# Patient Record
Sex: Male | Born: 1940 | Race: White | Hispanic: No | Smoking: Former smoker
Health system: Southern US, Community
[De-identification: ages and names within clinical notes are randomized; demographics above are authoritative.]

## PROBLEM LIST (undated history)

## (undated) DIAGNOSIS — E785 Hyperlipidemia, unspecified: Secondary | ICD-10-CM

## (undated) DIAGNOSIS — M5030 Other cervical disc degeneration, unspecified cervical region: Secondary | ICD-10-CM

## (undated) DIAGNOSIS — C679 Malignant neoplasm of bladder, unspecified: Secondary | ICD-10-CM

## (undated) DIAGNOSIS — Z8551 Personal history of malignant neoplasm of bladder: Secondary | ICD-10-CM

## (undated) DIAGNOSIS — K5790 Diverticulosis of intestine, part unspecified, without perforation or abscess without bleeding: Secondary | ICD-10-CM

## (undated) DIAGNOSIS — M5412 Radiculopathy, cervical region: Secondary | ICD-10-CM

## (undated) DIAGNOSIS — Z8546 Personal history of malignant neoplasm of prostate: Secondary | ICD-10-CM

## (undated) DIAGNOSIS — R3129 Other microscopic hematuria: Secondary | ICD-10-CM

## (undated) DIAGNOSIS — R8289 Other abnormal findings on cytological and histological examination of urine: Secondary | ICD-10-CM

## (undated) DIAGNOSIS — E119 Type 2 diabetes mellitus without complications: Secondary | ICD-10-CM

## (undated) DIAGNOSIS — K649 Unspecified hemorrhoids: Secondary | ICD-10-CM

## (undated) DIAGNOSIS — C61 Malignant neoplasm of prostate: Secondary | ICD-10-CM

## (undated) DIAGNOSIS — K635 Polyp of colon: Secondary | ICD-10-CM

## (undated) DIAGNOSIS — D509 Iron deficiency anemia, unspecified: Secondary | ICD-10-CM

## (undated) DIAGNOSIS — Z87442 Personal history of urinary calculi: Secondary | ICD-10-CM

## (undated) DIAGNOSIS — I10 Essential (primary) hypertension: Secondary | ICD-10-CM

## (undated) DIAGNOSIS — K4090 Unilateral inguinal hernia, without obstruction or gangrene, not specified as recurrent: Secondary | ICD-10-CM

## (undated) DIAGNOSIS — N529 Male erectile dysfunction, unspecified: Secondary | ICD-10-CM

## (undated) HISTORY — DX: Personal history of malignant neoplasm of bladder: Z85.51

## (undated) HISTORY — PX: CHOLECYSTECTOMY: SHX55

## (undated) HISTORY — DX: Other abnormal findings on cytological and histological examination of urine: R82.89

## (undated) HISTORY — DX: Male erectile dysfunction, unspecified: N52.9

## (undated) HISTORY — DX: Other microscopic hematuria: R31.29

## (undated) HISTORY — PX: CYSTOSCOPY WITH BIOPSY: SHX5122

## (undated) HISTORY — DX: Radiculopathy, cervical region: M54.12

## (undated) HISTORY — DX: Other cervical disc degeneration, unspecified cervical region: M50.30

## (undated) HISTORY — PX: OTHER SURGICAL HISTORY: SHX169

## (undated) HISTORY — PX: APPENDECTOMY: SHX54

## (undated) HISTORY — DX: Personal history of malignant neoplasm of prostate: Z85.46

---

## 2005-05-20 ENCOUNTER — Ambulatory Visit: Payer: Self-pay | Admitting: Unknown Physician Specialty

## 2005-05-21 ENCOUNTER — Ambulatory Visit: Payer: Self-pay | Admitting: Unknown Physician Specialty

## 2007-04-10 ENCOUNTER — Emergency Department: Payer: Self-pay | Admitting: Emergency Medicine

## 2007-07-01 ENCOUNTER — Emergency Department: Payer: Self-pay | Admitting: Emergency Medicine

## 2007-07-21 ENCOUNTER — Ambulatory Visit: Payer: Self-pay

## 2007-11-21 ENCOUNTER — Other Ambulatory Visit: Payer: Self-pay

## 2007-11-21 ENCOUNTER — Ambulatory Visit: Payer: Self-pay

## 2008-01-25 ENCOUNTER — Ambulatory Visit: Payer: Self-pay | Admitting: Otolaryngology

## 2011-02-02 ENCOUNTER — Ambulatory Visit: Payer: Self-pay | Admitting: Gastroenterology

## 2012-01-24 ENCOUNTER — Inpatient Hospital Stay: Payer: Self-pay | Admitting: Surgery

## 2012-01-24 LAB — URINALYSIS, COMPLETE
Bacteria: NONE SEEN
Bilirubin,UR: NEGATIVE
Glucose,UR: NEGATIVE mg/dL (ref 0–75)
Ketone: NEGATIVE
Leukocyte Esterase: NEGATIVE
Nitrite: NEGATIVE
Protein: NEGATIVE
RBC,UR: 1 /HPF (ref 0–5)
Specific Gravity: 1.021 (ref 1.003–1.030)
Squamous Epithelial: <1

## 2012-01-24 LAB — LIPASE, BLOOD: Lipase: 296 U/L (ref 73–393)

## 2012-01-24 LAB — COMPREHENSIVE METABOLIC PANEL
Albumin: 4 g/dL (ref 3.4–5.0)
Alkaline Phosphatase: 28 U/L — ABNORMAL LOW (ref 50–136)
BUN: 21 mg/dL — ABNORMAL HIGH (ref 7–18)
Co2: 31 mmol/L (ref 21–32)
Creatinine: 1.1 mg/dL (ref 0.60–1.30)
EGFR (African American): >60
Osmolality: 291 (ref 275–301)
Potassium: 3.7 mmol/L (ref 3.5–5.1)
SGOT(AST): 28 U/L (ref 15–37)
Total Protein: 7.7 g/dL (ref 6.4–8.2)

## 2012-01-24 LAB — CBC
HCT: 40.3 % (ref 40.0–52.0)
HGB: 13.3 g/dL (ref 13.0–18.0)
MCH: 28.8 pg (ref 26.0–34.0)
MCV: 87 fL (ref 80–100)
RDW: 13.7 % (ref 11.5–14.5)

## 2012-01-27 LAB — PATHOLOGY REPORT

## 2012-12-28 DIAGNOSIS — Z8551 Personal history of malignant neoplasm of bladder: Secondary | ICD-10-CM

## 2012-12-28 DIAGNOSIS — Z8546 Personal history of malignant neoplasm of prostate: Secondary | ICD-10-CM | POA: Insufficient documentation

## 2012-12-28 DIAGNOSIS — N529 Male erectile dysfunction, unspecified: Secondary | ICD-10-CM | POA: Insufficient documentation

## 2012-12-28 HISTORY — DX: Personal history of malignant neoplasm of prostate: Z85.46

## 2012-12-28 HISTORY — DX: Male erectile dysfunction, unspecified: N52.9

## 2012-12-28 HISTORY — DX: Personal history of malignant neoplasm of bladder: Z85.51

## 2013-01-16 ENCOUNTER — Ambulatory Visit: Payer: Self-pay | Admitting: Urology

## 2013-01-16 LAB — HEMOGLOBIN: HGB: 12.7 g/dL — ABNORMAL LOW (ref 13.0–18.0)

## 2013-01-16 LAB — BASIC METABOLIC PANEL
Anion Gap: 10 (ref 7–16)
Calcium, Total: 9.3 mg/dL (ref 8.5–10.1)
Creatinine: 1.23 mg/dL (ref 0.60–1.30)
EGFR (Non-African Amer.): 60 — ABNORMAL LOW
Glucose: 139 mg/dL — ABNORMAL HIGH (ref 65–99)
Osmolality: 286 (ref 275–301)

## 2013-01-17 LAB — URINE CULTURE

## 2013-01-24 ENCOUNTER — Ambulatory Visit: Payer: Self-pay | Admitting: Urology

## 2013-01-25 LAB — PATHOLOGY REPORT

## 2013-01-27 ENCOUNTER — Emergency Department: Payer: Self-pay | Admitting: Emergency Medicine

## 2013-01-27 LAB — CBC
MCHC: 33.7 g/dL (ref 32.0–36.0)
MCV: 85 fL (ref 80–100)
RBC: 4.39 10*6/uL — ABNORMAL LOW (ref 4.40–5.90)
RDW: 14.3 % (ref 11.5–14.5)
WBC: 10.2 10*3/uL (ref 3.8–10.6)

## 2013-01-27 LAB — COMPREHENSIVE METABOLIC PANEL
Anion Gap: 8 (ref 7–16)
BUN: 26 mg/dL — ABNORMAL HIGH (ref 7–18)
Bilirubin,Total: 0.6 mg/dL (ref 0.2–1.0)
Calcium, Total: 9.2 mg/dL (ref 8.5–10.1)
Creatinine: 1.22 mg/dL (ref 0.60–1.30)
EGFR (African American): >60
Potassium: 4.3 mmol/L (ref 3.5–5.1)
SGOT(AST): 30 U/L (ref 15–37)
Sodium: 138 mmol/L (ref 136–145)
Total Protein: 7.9 g/dL (ref 6.4–8.2)

## 2013-01-27 LAB — URINALYSIS, COMPLETE
Glucose,UR: NEGATIVE mg/dL (ref 0–75)
Ketone: NEGATIVE
Nitrite: NEGATIVE
Protein: NEGATIVE
RBC,UR: 66 /HPF (ref 0–5)
Specific Gravity: 1.011 (ref 1.003–1.030)
Squamous Epithelial: NONE SEEN
WBC UR: 25 /HPF (ref 0–5)

## 2014-03-25 ENCOUNTER — Ambulatory Visit: Payer: Self-pay | Admitting: Urology

## 2014-03-26 LAB — URINE CULTURE

## 2014-04-03 ENCOUNTER — Ambulatory Visit: Payer: Self-pay | Admitting: Urology

## 2014-04-05 LAB — PATHOLOGY REPORT

## 2014-12-30 DIAGNOSIS — M5412 Radiculopathy, cervical region: Secondary | ICD-10-CM

## 2014-12-30 DIAGNOSIS — M5030 Other cervical disc degeneration, unspecified cervical region: Secondary | ICD-10-CM

## 2014-12-30 HISTORY — DX: Radiculopathy, cervical region: M54.12

## 2014-12-30 HISTORY — DX: Other cervical disc degeneration, unspecified cervical region: M50.30

## 2015-01-07 ENCOUNTER — Ambulatory Visit: Payer: Self-pay | Admitting: Physical Medicine and Rehabilitation

## 2015-02-07 NOTE — Op Note (Signed)
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   PROCEDURES:  1.  Cystoscopy with bladder biopsies.  2.  Bilateral retrograde pyelograms, bilateral.   SURGEON: John Giovanni, M.D.   ASSISTANT: None.   ANESTHESIA: General.   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 His last recurrence was greater than 10 years ago. Recent surveillance cystoscopy in the office was unremarkable; however, a urine cytology returned abnormal cells and FISH was positive. He presents for further evaluation under anesthesia.   FINDINGS:  1.  Urethra. There were wide caliber bulb strictures of which a 21-French scope easily negotiated. No lesions were noted.  2.  Prostate surgically absent without bladder neck contracture. 3.  Bladder. Ureteral orifices were normal appearing with clear efflux. In the upper posterior wall and left lateral wall, adjacent areas of scar. There was minimal erythema with slight friability. No solid or papillary lesions were seen.  4.  Retrograde pyelograms. Ureters and collecting systems were normal in appearance without filling defects, mass, hydronephrosis or hydroureter bilaterally.   DESCRIPTION OF PROCEDURE: The patient was taken to the operating room where general anesthetic was administered. He was then placed in the low lithotomy position and his external genitalia were prepped and draped in the usual fashion. A time out was performed per protocol. A 21-French cystoscope was lubricated and passed under direct vision with findings as described above. A 5-French open-ended ureteral catheter was placed through the cystoscope and into the  left ureter and retrograde pyelogram was performed. Then the procedure was performed on the contralateral side with findings as described above. A cold biopsy forceps was then placed the cystoscope and the noted areas of erythema were biopsied and sent separately. Random bladder biopsies were also performed of the right lateral wall and trigone. All biopsy sites were fulgurated with a Bovie electrode. At the completion of the procedure, hemostasis was adequate. Cystoscope was removed. A 16-French Foley catheter was placed with return of light rosea effluent upon irrigation. He was taken to PACU in stable condition. There were no complications. EBL minimal.    ____________________________ Ronda Fairly. Bernardo Heater, MD scs:aw D: 01/24/2013 08:15:08 ET T: 01/24/2013 08:33:47 ET JOB#: [id number]  cc: Nicki Reaper C. Bernardo Heater, MD, <Dictator> Abbie Sons MD ELECTRONICALLY SIGNED 01/25/2013 8:06

## 2015-02-08 NOTE — Op Note (Signed)
PATIENT NAME:  Chase Wallace, Baumgarten MR#:  [id number] DATE OF BIRTH:  1940-12-04  DATE OF PROCEDURE:  04/03/2014  PREOPERATIVE DIAGNOSIS: Abnormal urine cytology with history of bladder cancer.   POSTOPERATIVE DIAGNOSIS:  Abnormal urine cytology with history of bladder cancer.   PROCEDURES:  1. Cystoscopy with random bladder biopsies.  2. Bilateral retrograde pyelograms.  3. Bilateral saline washings of the renal pelvis.   SURGEON:  John Giovanni, MD  ASSISTANT: None.   ANESTHETIC: General.   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  Surveillance cystoscopy performed October 2014.  It was unremarkable; however, a urine cytology returned moderately atypical urothelial cells and FISH was positive. He was in [place] after that visit and wanted to wait until spring for followup. A repeat urine cytology performed April 2015 returned dysplastic urothelial cells and FISH was positive. He denies gross hematuria. He has no voiding complaints.   DESCRIPTION OF PROCEDURE: The patient was taken to the operating room, placed on the table in the supine position. A general anesthetic was administered. He was then placed in the low lithotomy position and his external genitalia were prepped and draped in the usual fashion. A timeout was performed. A 21-French cystoscope was lubricated and passed under direct vision. The urethra was normal in caliber without stricture or lesions. The prostate was surgically absent. There was no evidence of bladder neck contracture. Bladder mucosa was closely inspected. There were no solid or papillary lesions present. There were some glomerulations noted throughout the posterior wall. The ureteral orifices were normal-appearing with a clear efflux. Using cold cup biopsy forceps, random bladder biopsies were obtained of the right lateral wall, posterior wall, left lateral wall and dome. Biopsy sites were fulgurated  with a Bugbee electrode. An 8-French cone-tipped catheter was placed through the cystoscope and placed into the left ureteral orifice. Retrograde pyelogram was performed and the left ureter was normal in caliber without dilation or filling defect. The left renal pelvis and collecting system showed delicate calyces without dilation or filling defect. There was good drainage of contrast from the system noted. A right retrograde pyelogram was performed in a similar fashion with no abnormal findings. A 0.035 guidewire was then placed through the cystoscope and into the left ureteral orifice. A 5-French open end ureteral catheter was placed up to the renal pelvis. Saline barbotage was performed and the specimen was collected and sent for cytology. The right ureteral orifice was catheterized in a similar fashion and saline barbotage was performed on the right. Ureteral catheter was removed. At the completion of procedure, hemostasis was adequate. A 16-French Foley catheter was placed with return of clear effluent upon irrigation.  B and O suppositories placed per rectum. He was taken to the PACU in stable condition. There were no complications. EBL was minimal.     ____________________________ Ronda Fairly. Bernardo Heater, MD scs:dd D: 04/03/2014 16:48:24 ET T: 04/03/2014 18:16:02 ET JOB#: [id number]  cc: Nicki Reaper C. Bernardo Heater, MD, <Dictator> Abbie Sons MD ELECTRONICALLY SIGNED 05/01/2014 22:13

## 2015-02-09 NOTE — H&P (Signed)
PATIENT NAME:  Chase Wallace, Chase Wallace MR#:  [id number] DATE OF BIRTH:  Oct 22, 1940  DATE OF ADMISSION:  01/24/2012  ADMITTING DIAGNOSES:  1. Acute calculus cholecystitis.  2. Right upper quadrant abdominal pain.  3. Hypertension.  4. Type 2 diabetes.  5. History of prostate cancer.   CHIEF COMPLAINT: Abdominal pain starting acutely yesterday at 4:00 p.m.   HISTORY: This is a 74 year old type II diabetic with a history of prostate cancer in remission status post prostatectomy who presents to the emergency room with acute onset of abdominal pain located in his right upper quadrant and right flank starting rather acutely yesterday around 4 or 5 o'clock in the afternoon. The patient denies any previous episodes of pain such as this. He is currently hungry. The patient denies any fever or jaundice. He has a mother with cholelithiasis requiring cholecystectomy in the past. He denies dysuria or jaundice. No sick contacts. Work-up in the emergency room consisted of an ultrasound which I have personally reviewed which demonstrates calcified gallstones and trace pericholecystic fluid, but no gallbladder wall thickening and no biliary ductal dilatation evident. A noncontrasted CT scan was also performed which demonstrates a mildly distended gallbladder with at least two large calcified stones seen within the gallbladder neck.   ALLERGIES: No known drug allergies.   MEDICATIONS: Lisinopril, metformin, Prilosec, and Zocor.   PAST MEDICAL HISTORY:  1. Hypertension.  2. Type 2 diabetes. 3. Prostate cancer.  4. Hyperlipidemia.  5. Reflux.   PAST SURGICAL HISTORY:  1. Open prostatectomy.  2. Open appendectomy. 3. Penile prosthesis.  4. Lancing of thrombosed hemorrhoid.  5. Colonoscopy.   SOCIAL HISTORY: The patient is retired, does not smoke, and does not drink.   FAMILY HISTORY: History is significant for cholelithiasis.   REVIEW OF SYSTEMS: Review is significant for abdominal pain. No fever. No jaundice.  No dysuria. No headache. No visual changes. The remaining ten-point review is unremarkable.   PHYSICAL EXAMINATION:   GENERAL: The patient is slightly disheveled and ill appearing, but nontoxic appearing.  VITALS: Pulse 63, respiratory rate 20 - unlabored and on room air, oxygen saturation 97%, blood pressure 160/75, and temperature 97.5. Weight 170 pounds and BMI 26.7.   LUNGS: Clear.   HEART: Regular rate and rhythm.   ABDOMEN: Abdomen demonstrates a lower midline scar, right lower quadrant transverse scar, significant tenderness to deep palpation and positive Murphy sign seen within the right upper quadrant. No obvious mass. No hernia.   RECTAL/GENITOURINARY: Deferred.   EXTREMITIES: Warm and well perfused.   NEUROLOGIC: Grossly normal. Affect is normal. Extraocular muscles are intact. Facies are symmetrical.   LABS/STUDIES: Urinalysis is negative.   Lipase is 296, which is normal. White count 10.1, hemoglobin 13.3, and platelet count 162,000. Total bilirubin 0.7, alkaline phosphatase 28, ALT 27, and AST 28. Sodium 143, potassium 3.7, chloride 103, glucose 149, and BUN 21.   REVIEW OF X-RAYS:  1. Ultrasound as described above, personally reviewed on PACS monitor.  2. CT scan as described above, personally reviewed on PACS monitor.   IMPRESSION: A 74 year old white male with history of hypertension and type 2 diabetes who presents with acute abdominal pain consistent with acute calculus cholecystitis of significant abdominal pain.   PLAN: The patient will be admitted. His pain will tried to be controlled with intravenous narcotics and antiemetics, hydration, and intravenous antibiotics will be administered. We will discuss with him cholecystectomy during this hospitalization when OR availability time becomes available.   TOTAL TIME SPENT: 45 minutes. ____________________________ Elta Gu[place]  Bettina Gavia, MD mab:slb D: 01/24/2012 07:52:05 ET T: 01/24/2012 09:46:18  ET JOB#: [id number]  cc: Elta Guadeloupe A. Marina Gravel, MD, <Dictator> Einar Pheasant, MD [place] MD ELECTRONICALLY SIGNED 01/26/2012 17:54

## 2015-02-09 NOTE — Discharge Summary (Signed)
PATIENT NAME:  Chase Wallace, Mangieri MR#:  [id number] DATE OF BIRTH:  07-27-1941  DATE OF ADMISSION:  01/24/2012 DATE OF DISCHARGE:  01/25/2012  PRINCIPLE DIAGNOSIS: Acute calculus cholecystitis with gallbladder hydrops.   OTHER DIAGNOSES:  1. Hypertension.  2. Type 2 diabetes mellitus. 3. History of prostate cancer.  PRINCIPLE PROCEDURE PERFORMED DURING THIS ADMISSION: Laparoscopic cholecystectomy.   HOSPITAL COURSE: Mr. Dicostanzo was admitted to the hospital, given IV fluids and antibiotics and taken to the Operating Room where he underwent the above-mentioned procedure for the above-mentioned diagnosis. On postoperative day one his temperature maximum was 100.8. He had had three loose bowel movements and was tolerating a diet and his pain was controlled with Norco therefore he was discharged home. He was asked to make an appointment to see me in the office and to call in the interim for any problems.   ____________________________ Consuela Mimes, MD wfm:cms D: 01/25/2012 21:21:24 ET T: 01/26/2012 10:15:13 ET JOB#: [id number]  cc: Consuela Mimes, MD, <Dictator> Consuela Mimes MD ELECTRONICALLY SIGNED 01/27/2012 21:42

## 2015-02-09 NOTE — Op Note (Signed)
PATIENT NAME:  Chase Wallace, Chase Wallace MR#:  [id number] DATE OF BIRTH:  01-14-1941  DATE OF PROCEDURE:  01/24/2012  PROCEDURE PERFORMED: Laparoscopic cholecystectomy.   PREOPERATIVE DIAGNOSIS: Acute cholecystitis.   POSTOPERATIVE DIAGNOSES:  1. Acute cholecystitis.  2. Gallbladder hydrops.   SURGEON: Consuela Mimes, MD  ANESTHESIA: General.   PROCEDURE IN DETAIL: The patient was placed supine on the Operating Room table and prepped and draped in the usual sterile fashion. A 15 mmHg CO2 pneumoperitoneum was created via a Veress needle that was used just to the right of the umbilicus and just slightly superior to it. This was exchanged for a 5 mm trocar and a 25 degrees angled scope. Remaining trocars were placed under direct visualization. The gallbladder had some loosely adherent omentum to it and this was peeled away and then the gallbladder was markedly distended and tense and it was decompressed of completely clear bile and then the fundus was grasped and retracted superiorly and ventrally and there was a lot of fat around the infundibulum of the gallbladder and this was all dissected off until the gallbladder wall itself could be retracted laterally in order to open up the triangle of Calot. Careful dissection here from the gallbladder down toward the common bile duct revealed a clear gallbladder cystic duct junction but the cystic duct itself was quite large. Anterior cystic artery was doubly clipped and divided and subsequently two posterior cystic arteries were each individually doubly clipped and divided such that a total of six hemoclips were left in the patient. Initially a hemolok clip was attempted to be placed over the cystic duct but it would not quite reach being about 1 mm too short. Therefore, a regular Hemoclip was placed at the gallbladder cystic junction and the cystic duct was cut. There was no bile that emanated from the cut cystic duct and I tried to squeeze what I thought was the  cystic duct between the gallbladder and the common bile duct to see if there was a stone impacted in the cystic duct stump but I could not ascertain whether there was one or not. It was my belief that one was not present but as I stated previously there was no bile that emanated from the cystic duct stump. The cystic duct stump was closed with a PDS Endoloop and the gallbladder was then removed from the liver bed with electrocautery, placed in an Endo Catch bag and extracted from the abdomen via the epigastric port. This port site fascia was stretched with a Kelly clamp and then the gallbladder was able to come through it. This fascia was then closed with a single 0 Vicryl suture using the laparoscopic puncture closure device and the right upper quadrant was then reinspected and irrigated with copious amounts of warm normal saline and this was suctioned clear. Hemostasis was excellent and the clips and Endoloop were secure. Some omentum was stuffed into the gallbladder bed and then the peritoneum was desufflated and decannulated and all four skin sites were closed with subcuticular 5-0 Monocryl and suture strips. The patient tolerated the procedure well. There were no complications.   ____________________________ Consuela Mimes, MD wfm:cms D: 01/24/2012 22:03:29 ET T: 01/25/2012 09:05:38 ET JOB#: [id number]  cc: Consuela Mimes, MD, <Dictator> Consuela Mimes MD ELECTRONICALLY SIGNED 01/25/2012 21:51

## 2015-06-03 DIAGNOSIS — R3129 Other microscopic hematuria: Secondary | ICD-10-CM

## 2015-06-03 HISTORY — DX: Other microscopic hematuria: R31.29

## 2015-06-04 DIAGNOSIS — R8289 Other abnormal findings on cytological and histological examination of urine: Secondary | ICD-10-CM

## 2015-06-04 HISTORY — DX: Other abnormal findings on cytological and histological examination of urine: R82.89

## 2016-01-19 ENCOUNTER — Other Ambulatory Visit: Payer: Self-pay | Admitting: Family Medicine

## 2016-01-19 ENCOUNTER — Ambulatory Visit
Admission: RE | Admit: 2016-01-19 | Discharge: 2016-01-19 | Disposition: A | Discharge status: Home / Self Care | Payer: Medicare PPO | Source: Ambulatory Visit | Attending: Family Medicine | Admitting: Family Medicine

## 2016-01-19 DIAGNOSIS — R531 Weakness: Secondary | ICD-10-CM

## 2016-01-19 DIAGNOSIS — K760 Fatty (change of) liver, not elsewhere classified: Secondary | ICD-10-CM | POA: Insufficient documentation

## 2016-01-19 DIAGNOSIS — R748 Abnormal levels of other serum enzymes: Secondary | ICD-10-CM

## 2016-01-19 DIAGNOSIS — D696 Thrombocytopenia, unspecified: Secondary | ICD-10-CM

## 2016-01-19 DIAGNOSIS — R918 Other nonspecific abnormal finding of lung field: Secondary | ICD-10-CM | POA: Insufficient documentation

## 2016-01-19 DIAGNOSIS — R197 Diarrhea, unspecified: Secondary | ICD-10-CM

## 2016-01-19 MED ORDER — IOHEXOL 350 MG/ML SOLN
125.0000 mL | Freq: Once | INTRAVENOUS | Status: AC | PRN
  Administered 2016-01-19: 125 mL via INTRAVENOUS

## 2017-08-08 ENCOUNTER — Other Ambulatory Visit: Payer: Medicare PPO

## 2017-08-08 ENCOUNTER — Other Ambulatory Visit: Payer: Self-pay

## 2017-08-08 DIAGNOSIS — Z8042 Family history of malignant neoplasm of prostate: Secondary | ICD-10-CM

## 2017-08-09 LAB — PSA

## 2017-08-10 ENCOUNTER — Ambulatory Visit (INDEPENDENT_AMBULATORY_CARE_PROVIDER_SITE_OTHER): Payer: Medicare PPO | Admitting: Urology

## 2017-08-10 ENCOUNTER — Encounter: Payer: Self-pay | Admitting: Urology

## 2017-08-10 VITALS — BP 177/93 | HR 101 | Ht 67.0 in | Wt 162.7 lb

## 2017-08-10 DIAGNOSIS — Z8546 Personal history of malignant neoplasm of prostate: Secondary | ICD-10-CM

## 2017-08-10 DIAGNOSIS — Z8551 Personal history of malignant neoplasm of bladder: Secondary | ICD-10-CM

## 2017-08-10 DIAGNOSIS — N529 Male erectile dysfunction, unspecified: Secondary | ICD-10-CM | POA: Diagnosis not present

## 2017-08-10 NOTE — Progress Notes (Signed)
08/10/2017 2:19 PM   Chase Wallace May 10, 1941 [id number]  Referring provider: Einar Pheasant, [address] [place], [place] [address]  Chief Complaint  Patient presents with  . Follow-up    HPI: 76 yo male with a history of low-grade Ta urothelial carcinoma. His last tumor was in 1999 however in 2013 he had urine cytologies which showed atypical cells. This has been extensively evaluated with multiple biopsies including bluelight cystoscopy. He had one biopsy in 2015 which showed PULMP. This was no evidence of upper tract abnormalities on CT urogram, retrograde pyelogram and selective cytologies.  Cystoscopy performed June 2018 was negative.  He also was diagnosed with high risk prostate cancer in 2000 and is status post radical prostatectomy.  He had postoperative erectile dysfunction and had placement of a penile prosthesis.  He states his prosthesis is still functioning however does take a longer time to deflate.  PMH: Past Medical History:  Diagnosis Date  . Abnormal findings on cytological and histological examination of urine 06/04/2015  . Abnormal urine cytology 06/04/2015  . Cervical radiculitis 12/30/2014  . DDD (degenerative disc disease), cervical 12/30/2014  . 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 History of bladder cancer 12/28/2012  . Male erectile dysfunction 12/28/2012  . Microscopic hematuria 06/03/2015  . Other cervical disc degeneration, unspecified cervical region 12/30/2014  . Personal history of prostate cancer 12/28/2012    Surgical History: . Prostatectomy  Radical Retropubic 1999  . Transurethral resection of prostate  . Appendectomy  . Cholecystectomy 2013  . Pr cystourethroscopy,biopsy N/A 09/04/2014  Procedure: CYSTOURETHROSCOPY, WITH BIOPSY(S); Surgeon: Abbie Sons, MD; Location: [facility]; Service: Urology  . Pr cystourethroscopy,fulgur <0.5 cm lesn N/A 11/13/2014  Procedure:  CYSTOURETHROSCOPY, W/FULGURATION (INCL CRYOSURGERY/LASER SURG) OR TX MINOR (<0.5CM) LESION(S) W/WO BIOPSY; Surgeon: Ronnald Nian, MD; Location: [facility]; Service: Urology  . Pr dil uret stric,male,init,filiform N/A 11/13/2014  Procedure: DILATION OF URETHRAL STRICTURE BY PASSAGE OF FILIFORM AND FOLLOWER, MALE; INITIAL; Surgeon: Ronnald Nian, MD; [facility]: [facility]; Service: Urology  . Pr insert,temp indwelling blad cath,simple N/A 11/13/2014  Procedure: INSERTION OF TEMPORARY INDWELLING BLADDER CATHETER; SIMPLE (EG, FOLEY); Surgeon: Ronnald Nian, MD; Location: [facility]; Service: Urology  . Pr cystourethroscopy,ureter catheter Bilateral 06/11/2015  Procedure: CYSTOURETHROSCOPY, W/URETERAL CATHETERIZATION, W/WO IRRIG, INSTILL, OR URETEROPYELOG, EXCLUS OF RADIOLG SVC; Surgeon: Abbie Sons, MD; Location: [facility]; Service: Urology    Home Medications:  Allergies as of 08/10/2017      Reactions   Nsaids Other (See Comments)   Elevated creatinine      Medication List       Accurate as of 08/10/17  2:19 PM. Always use your most recent med list.          amLODipine 10 MG tablet Commonly known as:  NORVASC Take 10 mg by mouth.   atorvastatin 20 MG tablet Commonly known as:  LIPITOR Take 20 mg by mouth.   azelastine 0.1 % nasal spray Commonly known as:  ASTELIN   fluticasone 50 MCG/ACT nasal spray Commonly known as:  FLONASE   glipiZIDE 5 MG tablet Commonly known as:  GLUCOTROL Take 5 mg by mouth.   HYDROcodone-acetaminophen 5-325 MG tablet Commonly known as:  NORCO/VICODIN 1/2-1 po bid Earliest Fill Date: 07/23/17   lisinopril 20 MG tablet Commonly known as:  PRINIVIL,ZESTRIL Take 20 mg by mouth.   metFORMIN 500 MG tablet Commonly known as:  GLUCOPHAGE  Take 500 mg by mouth.   omeprazole 20 MG capsule Commonly known as:  PRILOSEC Take 20 mg by mouth.   triamcinolone 0.025 % cream Commonly  known as:  KENALOG Frequency:BID   Dosage:0.0     Instructions:  Note:Dose: 0.025 %       Allergies:  Allergies  Allergen Reactions  . Nsaids Other (See Comments)    Elevated creatinine    Family History: History reviewed. No pertinent family history.  Social History:  reports that he has quit smoking. He does not have any smokeless tobacco history on file. His alcohol and drug histories are not on file.  ROS: UROLOGY Frequent Urination?: No Hard to postpone urination?: No Burning/pain with urination?: No Get up at night to urinate?: No Leakage of urine?: No Urine stream starts and stops?: No Trouble starting stream?: No Do you have to strain to urinate?: No Blood in urine?: No Urinary tract infection?: No Sexually transmitted disease?: No Injury to kidneys or bladder?: No Painful intercourse?: No Weak stream?: No Erection problems?: No Penile pain?: No  Gastrointestinal Nausea?: No Vomiting?: No Indigestion/heartburn?: No Diarrhea?: No Constipation?: No  Constitutional Fever: No Night sweats?: No Weight loss?: No Fatigue?: No  Skin Skin rash/lesions?: No Itching?: No  Eyes Blurred vision?: No Double vision?: No  Ears/Nose/Throat Sore throat?: No Sinus problems?: No  Hematologic/Lymphatic Swollen glands?: No Easy bruising?: No  Cardiovascular Leg swelling?: No Chest pain?: No  Respiratory Cough?: No Shortness of breath?: No  Endocrine Excessive thirst?: No  Musculoskeletal Back pain?: No Joint pain?: No  Neurological Headaches?: No Dizziness?: No  Psychologic Depression?: No Anxiety?: No  Physical Exam: BP (!) 177/93 (BP Location: Right Arm, Patient Position: Sitting, Cuff Size: Normal)   Pulse (!) 101   Ht 5\' 7"  (1.702 m)   Wt 162 lb 11.2 oz (73.8 kg)   BMI 25.48 kg/m   Constitutional:  Alert and oriented, No acute distress. HEENT: [place] AT, moist mucus membranes.  Trachea midline, no masses. Cardiovascular: No clubbing,  cyanosis, or edema. Respiratory: Normal respiratory effort, no increased work of breathing. GI: Abdomen is soft, nontender, nondistended, no abdominal masses GU: No CVA tenderness.  Skin: No rashes, bruises or suspicious lesions. Lymph: No cervical or inguinal adenopathy. Neurologic: Grossly intact, no focal deficits, moving all 4 extremities. Psychiatric: Normal mood and affect.  Laboratory Data: Lab Results  Component Value Date   WBC 10.2 01/27/2013   HGB 12.6 (L) 01/27/2013   HCT 37.3 (L) 01/27/2013   MCV 85 01/27/2013   PLT 220 01/27/2013    Lab Results  Component Value Date   CREATININE 1.22 01/27/2013    Lab Results  Component Value Date   PSA1 <0.1 08/08/2017     Urinalysis   Assessment & Plan:    1. Personal history of prostate cancer PSA drawn 10/22 undetectable at <0.1.  Continue annual follow-up.  2. History of bladder cancer He has elected to discontinue surveillance cystoscopy however will come in for an annual urinalysis  3. ED (erectile dysfunction) of organic origin    Return in about 1 year (around 08/10/2018) for Recheck.      Abbie Sons, [place] [address], [place], [place] [address] [phone]

## 2018-08-10 ENCOUNTER — Ambulatory Visit: Payer: Medicare PPO | Admitting: Urology

## 2018-08-17 ENCOUNTER — Ambulatory Visit (INDEPENDENT_AMBULATORY_CARE_PROVIDER_SITE_OTHER): Payer: Medicare PPO | Admitting: Urology

## 2018-08-17 ENCOUNTER — Encounter: Payer: Self-pay | Admitting: Urology

## 2018-08-17 VITALS — BP 161/82 | HR 99 | Ht 66.0 in | Wt 165.0 lb

## 2018-08-17 DIAGNOSIS — Z8546 Personal history of malignant neoplasm of prostate: Secondary | ICD-10-CM

## 2018-08-17 NOTE — Progress Notes (Signed)
08/17/2018 1:28 PM   Chase Wallace 11-Aug-1941 [id number]  Referring provider: Einar Pheasant, [address] [place], [place] [address]  Chief Complaint  Patient presents with  . Prostate Cancer   Urologic history: 1. T2 Gleason 4+5 prostate cancer status post radical prostatectomy 2000; PSA undetectable since surgery  2.  History low-grade Ta urothelial carcinoma -Last recurrence 1999 -2013 he had urine cytologies which showed atypical cells. This has been extensively evaluated with multiple biopsies including bluelight cystoscopy. He had one biopsy in 2015 which showed PULMP. No evidence of upper tract abnormalities on CT urogram, retrograde pyelogram and selective cytologies.  He elected to discontinue annual surveillance cystoscopy  3.  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  He has done well the past year.  He has no bothersome lower urinary tract symptoms.  Denies dysuria or gross hematuria.  Denies flank, abdominal, pelvic or scrotal pain.   PMH: Past Medical History:  Diagnosis Date  . Abnormal findings on cytological and histological examination of urine 06/04/2015  . Abnormal urine cytology 06/04/2015  . Cervical radiculitis 12/30/2014  . DDD (degenerative disc disease), cervical 12/30/2014  . 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 History of bladder cancer 12/28/2012  . Male erectile dysfunction 12/28/2012  . Microscopic hematuria 06/03/2015  . Other cervical disc degeneration, unspecified cervical region 12/30/2014  . Personal history of prostate cancer 12/28/2012    Surgical History: Multiple TURBT Radical prostatectomy 2000 Placement penile prosthesis  Home Medications:  Allergies as of 08/17/2018      Reactions   Nsaids Other (See Comments)   Elevated creatinine      Medication List        Accurate as of 08/17/18  1:28 PM. Always use  your most recent med list.          amLODipine 10 MG tablet Commonly known as:  NORVASC Take 10 mg by mouth.   atorvastatin 20 MG tablet Commonly known as:  LIPITOR Take 20 mg by mouth.   azelastine 0.1 % nasal spray Commonly known as:  ASTELIN   fluticasone 50 MCG/ACT nasal spray Commonly known as:  FLONASE   glipiZIDE 5 MG tablet Commonly known as:  GLUCOTROL Take 5 mg by mouth.   HYDROcodone-acetaminophen 5-325 MG tablet Commonly known as:  NORCO/VICODIN 1/2-1 po bid Earliest Fill Date: 07/23/17   Influenza vac split quadrivalent PF 0.5 ML injection Commonly known as:  FLUZONE HIGH-DOSE Fluzone High-Dose 2017-2018 (PF) 180 mcg/0.5 mL intramuscular syringe   lisinopril 20 MG tablet Commonly known as:  PRINIVIL,ZESTRIL Take 20 mg by mouth.   metFORMIN 500 MG tablet Commonly known as:  GLUCOPHAGE Take 500 mg by mouth.   omeprazole 20 MG capsule Commonly known as:  PRILOSEC Take 20 mg by mouth.   triamcinolone 0.025 % cream Commonly known as:  KENALOG Frequency:BID   Dosage:0.0     Instructions:  Note:Dose: 0.025 %       Allergies:  Allergies  Allergen Reactions  . Nsaids Other (See Comments)    Elevated creatinine    Family History: No family history on file.  Social History:  reports that he has quit smoking. He does not have any smokeless tobacco history on file. His alcohol and drug histories are not on file.  ROS: UROLOGY Frequent Urination?: No Hard to postpone urination?: No Burning/pain with urination?: No Get up at night to urinate?: No Leakage of urine?:  No Urine stream starts and stops?: No Trouble starting stream?: No Do you have to strain to urinate?: No Blood in urine?: No Urinary tract infection?: No Sexually transmitted disease?: No Injury to kidneys or bladder?: No Painful intercourse?: No Weak stream?: No Erection problems?: No Penile pain?: No  Gastrointestinal Nausea?: No Vomiting?: No Indigestion/heartburn?:  No Diarrhea?: No Constipation?: No  Constitutional Fever: No Night sweats?: No Weight loss?: No Fatigue?: No  Skin Skin rash/lesions?: No Itching?: No  Eyes Blurred vision?: No Double vision?: No  Ears/Nose/Throat Sore throat?: No Sinus problems?: No  Hematologic/Lymphatic Swollen glands?: No Easy bruising?: No  Cardiovascular Leg swelling?: No Chest pain?: No  Respiratory Cough?: No Shortness of breath?: No  Endocrine Excessive thirst?: No  Musculoskeletal Back pain?: No Joint pain?: No  Neurological Headaches?: No Dizziness?: No  Psychologic Depression?: No Anxiety?: No  Physical Exam: BP (!) 161/82 (BP Location: Left Arm, Patient Position: Sitting, Cuff Size: Normal)   Pulse 99   Ht 5\' 6"  (1.676 m)   Wt 165 lb (74.8 kg)   BMI 26.63 kg/m   Constitutional:  Alert and oriented, No acute distress.   Assessment & Plan:   77 year old male with a history of prostate cancer proximal my 20 years ago.  PSA has remained undetectable however he desires to continue annual PSA.  He elected to discontinue surveillance cystoscopy.  He was unable to give a urine today.  Continue annual follow-up.   Abbie Sons, [place] [address], [place], [place] [address] [phone]

## 2018-08-18 ENCOUNTER — Telehealth: Payer: Self-pay | Admitting: Family Medicine

## 2018-08-18 LAB — PSA

## 2018-08-18 NOTE — Telephone Encounter (Signed)
Patient notified

## 2018-08-18 NOTE — Telephone Encounter (Signed)
-----   Message from Abbie Sons, MD sent at 08/18/2018  7:07 AM EDT ----- PSA remains undetectable at <0.1

## 2018-11-13 ENCOUNTER — Encounter: Payer: Self-pay | Admitting: *Deleted

## 2018-11-14 ENCOUNTER — Encounter: Payer: Self-pay | Admitting: Anesthesiology

## 2018-11-14 ENCOUNTER — Encounter
Admission: RE | Disposition: A | Discharge status: Home / Self Care | Payer: Self-pay | Source: Home / Self Care | Attending: Gastroenterology

## 2018-11-14 ENCOUNTER — Ambulatory Visit: Payer: Medicare PPO | Admitting: Anesthesiology

## 2018-11-14 ENCOUNTER — Ambulatory Visit
Admission: RE | Admit: 2018-11-14 | Discharge: 2018-11-14 | Disposition: A | Discharge status: Home / Self Care | Payer: Medicare PPO | Attending: Gastroenterology | Admitting: Gastroenterology

## 2018-11-14 DIAGNOSIS — Z8551 Personal history of malignant neoplasm of bladder: Secondary | ICD-10-CM | POA: Diagnosis not present

## 2018-11-14 DIAGNOSIS — Z7984 Long term (current) use of oral hypoglycemic drugs: Secondary | ICD-10-CM | POA: Diagnosis not present

## 2018-11-14 DIAGNOSIS — M1990 Unspecified osteoarthritis, unspecified site: Secondary | ICD-10-CM | POA: Insufficient documentation

## 2018-11-14 DIAGNOSIS — I10 Essential (primary) hypertension: Secondary | ICD-10-CM | POA: Diagnosis not present

## 2018-11-14 DIAGNOSIS — Z8546 Personal history of malignant neoplasm of prostate: Secondary | ICD-10-CM | POA: Insufficient documentation

## 2018-11-14 DIAGNOSIS — M5030 Other cervical disc degeneration, unspecified cervical region: Secondary | ICD-10-CM | POA: Diagnosis not present

## 2018-11-14 DIAGNOSIS — Z79899 Other long term (current) drug therapy: Secondary | ICD-10-CM | POA: Insufficient documentation

## 2018-11-14 DIAGNOSIS — E119 Type 2 diabetes mellitus without complications: Secondary | ICD-10-CM | POA: Diagnosis not present

## 2018-11-14 DIAGNOSIS — E785 Hyperlipidemia, unspecified: Secondary | ICD-10-CM | POA: Insufficient documentation

## 2018-11-14 DIAGNOSIS — K5730 Diverticulosis of large intestine without perforation or abscess without bleeding: Secondary | ICD-10-CM | POA: Insufficient documentation

## 2018-11-14 DIAGNOSIS — K640 First degree hemorrhoids: Secondary | ICD-10-CM | POA: Insufficient documentation

## 2018-11-14 DIAGNOSIS — D123 Benign neoplasm of transverse colon: Secondary | ICD-10-CM | POA: Diagnosis not present

## 2018-11-14 DIAGNOSIS — Z1211 Encounter for screening for malignant neoplasm of colon: Secondary | ICD-10-CM | POA: Diagnosis present

## 2018-11-14 DIAGNOSIS — Z86010 Personal history of colonic polyps: Secondary | ICD-10-CM | POA: Diagnosis not present

## 2018-11-14 HISTORY — DX: Malignant neoplasm of bladder, unspecified: C67.9

## 2018-11-14 HISTORY — DX: Unspecified hemorrhoids: K64.9

## 2018-11-14 HISTORY — DX: Hyperlipidemia, unspecified: E78.5

## 2018-11-14 HISTORY — PX: COLONOSCOPY WITH PROPOFOL: SHX5780

## 2018-11-14 HISTORY — DX: Type 2 diabetes mellitus without complications: E11.9

## 2018-11-14 HISTORY — DX: Essential (primary) hypertension: I10

## 2018-11-14 HISTORY — DX: Polyp of colon: K63.5

## 2018-11-14 LAB — GLUCOSE, CAPILLARY: Glucose-Capillary: 135 mg/dL — ABNORMAL HIGH (ref 70–99)

## 2018-11-14 SURGERY — COLONOSCOPY WITH PROPOFOL
Anesthesia: General

## 2018-11-14 MED ORDER — MIDAZOLAM HCL 2 MG/2ML IJ SOLN
INTRAMUSCULAR | Status: DC | PRN
  Administered 2018-11-14 (×2): 1 mg via INTRAVENOUS

## 2018-11-14 MED ORDER — FENTANYL CITRATE (PF) 100 MCG/2ML IJ SOLN
INTRAMUSCULAR | Status: AC
  Filled 2018-11-14: qty 2

## 2018-11-14 MED ORDER — PROPOFOL 500 MG/50ML IV EMUL
INTRAVENOUS | Status: AC
  Filled 2018-11-14: qty 50

## 2018-11-14 MED ORDER — FENTANYL CITRATE (PF) 100 MCG/2ML IJ SOLN
INTRAMUSCULAR | Status: DC | PRN
  Administered 2018-11-14: 25 ug via INTRAVENOUS
  Administered 2018-11-14: 50 ug via INTRAVENOUS
  Administered 2018-11-14: 25 ug via INTRAVENOUS

## 2018-11-14 MED ORDER — SODIUM CHLORIDE 0.9 % IV SOLN
INTRAVENOUS | Status: DC
  Administered 2018-11-14: 1000 mL via INTRAVENOUS

## 2018-11-14 MED ORDER — PROPOFOL 10 MG/ML IV BOLUS
INTRAVENOUS | Status: AC
  Filled 2018-11-14: qty 20

## 2018-11-14 MED ORDER — PROPOFOL 500 MG/50ML IV EMUL
INTRAVENOUS | Status: DC | PRN
  Administered 2018-11-14: 120 ug/kg/min via INTRAVENOUS

## 2018-11-14 MED ORDER — MIDAZOLAM HCL 2 MG/2ML IJ SOLN
INTRAMUSCULAR | Status: AC
  Filled 2018-11-14: qty 2

## 2018-11-14 NOTE — H&P (Signed)
Outpatient short stay form Pre-procedure 11/14/2018 10:11 AM Lollie Sails MD  Primary Physician: Einar Pheasant, MD  Reason for visit: Colonoscopy  History of present illness: Patient is a 78 year old male presenting today for colonoscopy.  He does have a personal history of adenomatous colon polyps his last procedure being done in 2016 in [place].  Tolerated his prep well.  He takes no aspirin or blood thinning agent.      Current Facility-Administered Medications:  .  0.9 %  sodium chloride infusion, , Intravenous, Continuous, Lollie Sails, MD, Last Rate: 20 mL/hr at 11/14/18 0950, 1,000 mL at 11/14/18 0950  Medications Prior to Admission  Medication Sig Dispense Refill Last Dose  . amLODipine (NORVASC) 10 MG tablet Take 10 mg by mouth.   11/14/2018 at 0630  . atorvastatin (LIPITOR) 20 MG tablet Take 20 mg by mouth.   11/13/2018 at Unknown time  . azelastine (ASTELIN) 0.1 % nasal spray    Past Week at Unknown time  . fluticasone (FLONASE) 50 MCG/ACT nasal spray    Past Week at Unknown time  . lisinopril (PRINIVIL,ZESTRIL) 20 MG tablet Take 20 mg by mouth.   11/14/2018 at 0630  . metFORMIN (GLUCOPHAGE) 500 MG tablet Take 500 mg by mouth.   Past Week at Unknown time  . omeprazole (PRILOSEC) 20 MG capsule Take 20 mg by mouth.   Past Week at Unknown time  . triamcinolone (KENALOG) 0.025 % cream Frequency:BID   Dosage:0.0     Instructions:  Note:Dose: 0.025 %   Past Week at Unknown time  . glipiZIDE (GLUCOTROL) 5 MG tablet Take 5 mg by mouth.   Not Taking at Unknown time  . HYDROcodone-acetaminophen (NORCO/VICODIN) 5-325 MG tablet 1/2-1 po bid Earliest Fill Date: 07/23/17   Not Taking at Unknown time  . Influenza vac split quadrivalent PF (FLUZONE HIGH-DOSE) 0.5 ML injection Fluzone High-Dose 2017-2018 (PF) 180 mcg/0.5 mL intramuscular syringe   Taking     Allergies  Allergen Reactions  . Nsaids Other (See Comments)    Elevated creatinine     Past Medical History:   Diagnosis Date  . Abnormal findings on cytological and histological examination of urine 06/04/2015  . Abnormal urine cytology 06/04/2015  . Bladder cancer ([place])   . Cervical radiculitis 12/30/2014  . Colon polyps   . DDD (degenerative disc disease), cervical 12/30/2014  . Diabetes mellitus without complication ([place])   . 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 Hemorrhoids   . History of bladder cancer 12/28/2012  . Hyperlipidemia   . Hypertension   . Male erectile dysfunction 12/28/2012  . Microscopic hematuria 06/03/2015  . Other cervical disc degeneration, unspecified cervical region 12/30/2014  . Personal history of prostate cancer 12/28/2012    Review of systems:      Physical Exam    Heart and lungs: Regular rate and rhythm without rub or gallop, lungs are bilaterally clear.    HEENT: Normocephalic atraumatic eyes are anicteric    Other:    Pertinant exam for procedure: Soft nontender nondistended bowel sounds positive normoactive    Planned proceedures: Colonoscopy and indicated procedures. I have discussed the risks benefits and complications of procedures to include not limited to bleeding, infection, perforation and the risk of sedation and the patient wishes to proceed.    Lollie Sails, MD Gastroenterology 11/14/2018  10:11 AM

## 2018-11-14 NOTE — Op Note (Signed)
[facility] Gastroenterology Patient Name: Chase Wallace Procedure Date: 11/14/2018 10:08 AM MRN: [id number] Account #: [id number] Date of Birth: September 22, 1941 Admit Type: Outpatient Age: 78 Room: [facility] ENDO ROOM 3 Gender: Male Note Status: Finalized Procedure:            Colonoscopy Indications:          Personal history of colonic polyps Providers:            Lollie Sails, MD Referring MD:         Einar Pheasant, MD (Referring MD) Medicines:            Monitored Anesthesia Care Complications:        No immediate complications. Procedure:            Pre-Anesthesia Assessment:                       - ASA Grade Assessment: III - A patient with severe                        systemic disease.                       After obtaining informed consent, the colonoscope was                        passed under direct vision. Throughout the procedure,                        the patient's blood pressure, pulse, and oxygen                        saturations were monitored continuously. The                        Colonoscope was introduced through the anus and                        advanced to the the cecum, identified by appendiceal                        orifice and ileocecal valve. The colonoscopy was                        unusually difficult due to poor bowel prep and                        significant looping. Successful completion of the                        procedure was aided by changing the patient to a supine                        position, changing the patient to a prone position and                        using manual pressure. The patient tolerated the                        procedure well. The quality of the bowel preparation  was good except the ascending colon was poor. Findings:      A 1 mm polyp was found in the transverse colon. The polyp was sessile.       The polyp was removed with a cold biopsy forceps. Resection and   retrieval were complete.      A 10 mm polyp was found in the transverse colon. The polyp was sessile.       The polyp was removed with a cold snare. Resection and retrieval were       complete.      Multiple small and large-mouthed diverticula were found in the sigmoid       colon, descending colon, transverse colon and ascending colon.      Non-bleeding internal hemorrhoids were found during retroflexion and       during anoscopy. The hemorrhoids were small and Grade I (internal       hemorrhoids that do not prolapse).      The digital rectal exam was normal. Impression:           - One 1 mm polyp in the transverse colon, removed with                        a cold biopsy forceps. Resected and retrieved.                       - One 10 mm polyp in the transverse colon, removed with                        a cold snare. Resected and retrieved.                       - Diverticulosis in the sigmoid colon, in the                        descending colon, in the transverse colon and in the                        ascending colon.                       - Non-bleeding internal hemorrhoids. Recommendation:       - Discharge patient to home.                       - Await pathology results.                       - Telephone GI clinic for pathology results in 1 week. Procedure Code(s):    --- Professional ---                       [phone], Colonoscopy, flexible; with removal of tumor(s),                        polyp(s), or other lesion(s) by snare technique                       50932, 34, Colonoscopy, flexible; with biopsy, single                        or multiple Diagnosis Code(s):    --- Professional ---  D12.3, Benign neoplasm of transverse colon (hepatic                        flexure or splenic flexure)                       K64.0, First degree hemorrhoids                       Z86.010, Personal history of colonic polyps                       K57.30, Diverticulosis of large  intestine without                        perforation or abscess without bleeding CPT copyright 2018 American Medical Association. All rights reserved. The codes documented in this report are preliminary and upon coder review may  be revised to meet current compliance requirements. Lollie Sails, MD 11/14/2018 11:06:39 AM This report has been signed electronically. Number of Addenda: 0 Note Initiated On: 11/14/2018 10:08 AM Scope Withdrawal Time: 0 hours 11 minutes 55 seconds  Total Procedure Duration: 0 hours 34 minutes 7 seconds       [facility]

## 2018-11-14 NOTE — Anesthesia Postprocedure Evaluation (Signed)
Anesthesia Post Note  Patient: Chase Wallace  Procedure(s) Performed: COLONOSCOPY WITH PROPOFOL (N/A )  Patient location during evaluation: Endoscopy Anesthesia Type: General Level of consciousness: awake and alert Pain management: pain level controlled Vital Signs Assessment: post-procedure vital signs reviewed and stable Respiratory status: spontaneous breathing, nonlabored ventilation, respiratory function stable and patient connected to nasal cannula oxygen Cardiovascular status: blood pressure returned to baseline and stable Postop Assessment: no apparent nausea or vomiting Anesthetic complications: no     Last Vitals:  Vitals:   11/14/18 1120 11/14/18 1130  BP: 136/75 133/81  Pulse: (!) 106 82  Resp: 17 20  Temp:    SpO2: 100% 99%    Last Pain:  Vitals:   11/14/18 1100  TempSrc: Tympanic  PainSc:                  [name]

## 2018-11-14 NOTE — Anesthesia Preprocedure Evaluation (Signed)
Anesthesia Evaluation  Patient identified by MRN, date of birth, ID band Patient awake    Reviewed: Allergy & Precautions, NPO status , Patient's Chart, lab work & pertinent test results  History of Anesthesia Complications Negative for: history of anesthetic complications  Airway Mallampati: II  TM Distance: >3 FB Neck ROM: Full    Dental no notable dental hx.    Pulmonary neg sleep apnea, neg COPD, former smoker, neg PE   breath sounds clear to auscultation- rhonchi (-) wheezing      Cardiovascular Exercise Tolerance: Good hypertension, Pt. on medications (-) CAD, (-) Past MI, (-) Cardiac Stents and (-) CABG  Rhythm:Regular Rate:Normal - Systolic murmurs and - Diastolic murmurs    Neuro/Psych neg Seizures negative neurological ROS  negative psych ROS   GI/Hepatic negative GI ROS, Neg liver ROS,   Endo/Other  diabetes, Oral Hypoglycemic Agents  Renal/GU negative Renal ROS     Musculoskeletal  (+) Arthritis ,   Abdominal (+) - obese,   Peds  Hematology   Anesthesia Other Findings Past Medical History: 06/04/2015: Abnormal findings on cytological and histological  examination of urine 06/04/2015: Abnormal urine cytology No date: Bladder cancer ([place]) 12/30/2014: Cervical radiculitis No date: Colon polyps 12/30/2014: DDD (degenerative disc disease), cervical No date: Diabetes mellitus without complication ([place]) 9/32/6712: ED (erectile dysfunction) of organic origin No date: Hemorrhoids 12/28/2012: History of bladder cancer No date: Hyperlipidemia No date: Hypertension 12/28/2012: Male erectile dysfunction 06/03/2015: Microscopic hematuria 12/30/2014: Other cervical disc degeneration, unspecified cervical  region 12/28/2012: Personal history of prostate cancer   Reproductive/Obstetrics                             Anesthesia Physical Anesthesia Plan  ASA: II  Anesthesia Plan: General    Post-op Pain Management:    Induction: Intravenous  PONV Risk Score and Plan: 1 and Propofol infusion  Airway Management Planned: Natural Airway  Additional Equipment:   Intra-op Plan:   Post-operative Plan:   Informed Consent: I have reviewed the patients History and Physical, chart, labs and discussed the procedure including the risks, benefits and alternatives for the proposed anesthesia with the patient or authorized representative who has indicated his/her understanding and acceptance.     Dental advisory given  Plan Discussed with: CRNA and Anesthesiologist  Anesthesia Plan Comments:         Anesthesia Quick Evaluation

## 2018-11-14 NOTE — Transfer of Care (Signed)
Immediate Anesthesia Transfer of Care Note  Patient: Chase Wallace  Procedure(s) Performed: COLONOSCOPY WITH PROPOFOL (N/A )  Patient Location: PACU  Anesthesia Type:General  Level of Consciousness: awake and sedated  Airway & Oxygen Therapy: Patient Spontanous Breathing and Patient connected to nasal cannula oxygen  Post-op Assessment: Report given to RN and Post -op Vital signs reviewed and stable  Post vital signs: Reviewed and stable  Last Vitals:  Vitals Value Taken Time  BP    Temp    Pulse    Resp    SpO2      Last Pain:  Vitals:   11/14/18 0933  TempSrc: Tympanic  PainSc: 0-No pain         Complications: No apparent anesthesia complications

## 2018-11-14 NOTE — Anesthesia Procedure Notes (Signed)
Performed by: Cook-Martin, Semiyah Newgent Pre-anesthesia Checklist: Patient identified, Emergency Drugs available, Suction available, Patient being monitored and Timeout performed Patient Re-evaluated:Patient Re-evaluated prior to induction Oxygen Delivery Method: Nasal cannula Preoxygenation: Pre-oxygenation with 100% oxygen Induction Type: IV induction Placement Confirmation: positive ETCO2 and CO2 detector       

## 2018-11-14 NOTE — Anesthesia Post-op Follow-up Note (Signed)
Anesthesia QCDR form completed.        

## 2018-11-14 NOTE — Anesthesia Procedure Notes (Signed)
Performed by: Cook-Martin, Lilybeth Vien       

## 2018-11-15 ENCOUNTER — Encounter: Payer: Self-pay | Admitting: Gastroenterology

## 2018-11-15 ENCOUNTER — Encounter: Payer: Self-pay | Admitting: Internal Medicine

## 2018-11-15 DIAGNOSIS — Z860100 Personal history of colon polyps, unspecified: Secondary | ICD-10-CM | POA: Insufficient documentation

## 2018-11-15 LAB — SURGICAL PATHOLOGY

## 2019-08-15 ENCOUNTER — Ambulatory Visit: Payer: Medicare PPO | Admitting: Urology

## 2019-08-17 ENCOUNTER — Ambulatory Visit: Payer: Medicare PPO | Admitting: Urology

## 2019-08-17 ENCOUNTER — Other Ambulatory Visit: Payer: Self-pay

## 2019-08-17 ENCOUNTER — Encounter: Payer: Self-pay | Admitting: Urology

## 2019-08-17 VITALS — BP 163/84 | HR 109 | Ht 66.0 in | Wt 155.6 lb

## 2019-08-17 DIAGNOSIS — Z8546 Personal history of malignant neoplasm of prostate: Secondary | ICD-10-CM

## 2019-08-17 DIAGNOSIS — Z8551 Personal history of malignant neoplasm of bladder: Secondary | ICD-10-CM

## 2019-08-17 LAB — URINALYSIS, COMPLETE
Bilirubin, UA: NEGATIVE
Ketones, UA: NEGATIVE
Leukocytes,UA: NEGATIVE
Nitrite, UA: NEGATIVE
RBC, UA: NEGATIVE
Specific Gravity, UA: 1.02 (ref 1.005–1.030)
Urobilinogen, Ur: 1 mg/dL (ref 0.2–1.0)
pH, UA: 5 (ref 5.0–7.5)

## 2019-08-17 LAB — MICROSCOPIC EXAMINATION
Bacteria, UA: NONE SEEN
RBC, Urine: NONE SEEN /hpf (ref 0–2)

## 2019-08-17 NOTE — Progress Notes (Signed)
08/17/2019 9:10 AM   Chase Wallace [birth year] [id number]  Referring provider: Einar Pheasant, [place] [id number] [place],   [phone]  Chief Complaint  Patient presents with  . Follow-up    Urologic history: 1. T2 Gleason 4+5 prostate cancer status post radical prostatectomy 2000; PSA undetectable since surgery  2.  History low-grade Ta urothelial carcinoma -Last recurrence 1999 -2013 he had urine cytologies which showed atypical cells. This has been extensively evaluated with multiple biopsies including bluelight cystoscopy. He had one biopsy in 2015 which showed PULMP. No evidence of upper tract abnormalities on CT urogram, retrograde pyelogram and selective cytologies.  He elected to discontinue annual surveillance cystoscopy  3.  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 male presents for annual follow-up.  He states he has been doing well and has no complaints.  He has no voiding complaints.  Denies dysuria or gross hematuria.  Denies flank, abdominal or pelvic pain.   PMH: Past Medical History:  Diagnosis Date  . Abnormal findings on cytological and histological examination of urine 06/04/2015  . Abnormal urine cytology 06/04/2015  . Bladder cancer ([place])   . Cervical radiculitis 12/30/2014  . Colon polyps   . DDD (degenerative disc disease), cervical 12/30/2014  . Diabetes mellitus without complication ([place])   . 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 Hemorrhoids   . History of bladder cancer 12/28/2012  . Hyperlipidemia   . Hypertension   . Male erectile dysfunction 12/28/2012  . Microscopic hematuria 06/03/2015  . Other cervical disc degeneration, unspecified cervical region 12/30/2014  . Personal history of prostate cancer 12/28/2012    Surgical History: Past Surgical History:  Procedure Laterality Date  . APPENDECTOMY    . CHOLECYSTECTOMY    . COLONOSCOPY WITH PROPOFOL N/A  11/14/2018   Procedure: COLONOSCOPY WITH PROPOFOL;  Surgeon: Lollie Sails, MD;  Location: [facility] ENDOSCOPY;  Service: Endoscopy;  Laterality: N/A;  . Laproscopic Prostatectomy    . Removal of Nasal Polyps      Home Medications:  Allergies as of 08/17/2019      Reactions   Nsaids Other (See Comments)   Elevated creatinine      Medication List       Accurate as of August 17, 2019  9:10 AM. If you have any questions, ask your nurse or doctor.        amLODipine 10 MG tablet Commonly known as: NORVASC Take 10 mg by mouth.   atorvastatin 20 MG tablet Commonly known as: LIPITOR Take 20 mg by mouth.   azelastine 0.1 % nasal spray Commonly known as: ASTELIN   fluticasone 50 MCG/ACT nasal spray Commonly known as: FLONASE   glipiZIDE 5 MG tablet Commonly known as: GLUCOTROL Take 5 mg by mouth.   HYDROcodone-acetaminophen 5-325 MG tablet Commonly known as: NORCO/VICODIN 1/2-1 po bid What changed: Another medication with the same name was removed. Continue taking this medication, and follow the directions you see here. Changed by: Abbie Sons, MD   Influenza vac split quadrivalent PF 0.5 ML injection Commonly known as: FLUZONE HIGH-DOSE Fluzone High-Dose 2017-2018 (PF) 180 mcg/0.5 mL intramuscular syringe   lisinopril 20 MG tablet Commonly known as: ZESTRIL Take 20 mg by mouth.   Melatonin 1 MG Tabs Take by mouth.   metFORMIN 500 MG tablet Commonly known as: GLUCOPHAGE Take 500 mg by mouth.   omeprazole 20 MG capsule Commonly known as: PRILOSEC Take 20 mg by  mouth.   triamcinolone 0.025 % cream Commonly known as: KENALOG Frequency:BID   Dosage:0.0     Instructions:  Note:Dose: 0.025 %       Allergies:  Allergies  Allergen Reactions  . Nsaids Other (See Comments)    Elevated creatinine    Family History: No family history on file.  Social History:  reports that he has quit smoking. He has never used smokeless tobacco. He reports that he does  not drink alcohol or use drugs.  ROS: UROLOGY Frequent Urination?: No Hard to postpone urination?: No Burning/pain with urination?: No Get up at night to urinate?: No Leakage of urine?: No Urine stream starts and stops?: No Trouble starting stream?: No Do you have to strain to urinate?: No Blood in urine?: No Urinary tract infection?: No Sexually transmitted disease?: No Injury to kidneys or bladder?: No Painful intercourse?: No Weak stream?: No Erection problems?: No Penile pain?: No  Gastrointestinal Nausea?: No Vomiting?: No Indigestion/heartburn?: No Diarrhea?: No Constipation?: No  Constitutional Fever: No Night sweats?: No Weight loss?: No Fatigue?: No  Skin Skin rash/lesions?: No Itching?: No  Eyes Blurred vision?: No Double vision?: No  Ears/Nose/Throat Sore throat?: No Sinus problems?: No  Hematologic/Lymphatic Swollen glands?: No Easy bruising?: No  Cardiovascular Leg swelling?: No Chest pain?: No  Respiratory Cough?: No Shortness of breath?: No  Endocrine Excessive thirst?: No  Musculoskeletal Back pain?: No Joint pain?: Yes  Neurological Headaches?: No Dizziness?: No  Psychologic Depression?: No Anxiety?: No  Physical Exam: BP (!) 163/84 (BP Location: Left Arm, Patient Position: Sitting, Cuff Size: Normal)   Pulse (!) 109   Ht 5\' 6"  (1.676 m)   Wt 155 lb 9.6 oz (70.6 kg)   BMI 25.11 kg/m   Constitutional:  Alert and oriented, No acute distress. HEENT: [place] AT, moist mucus membranes.  Trachea midline, no masses. Cardiovascular: No clubbing, cyanosis, or edema. Respiratory: Normal respiratory effort, no increased work of breathing.   Assessment & Plan:    - Personal history prostate cancer PSA has remained undetectable since surgery.  He desires to continue annual PSA.  - Personal history low-grade bladder cancer He elected to discontinue annual surveillance cystoscopy.  Urinalysis was ordered and he will be notified  with the results.  Continue annual follow-up   Abbie Sons, MD  [facility] [address] [place], [place] [address] [phone]

## 2019-08-18 ENCOUNTER — Encounter: Payer: Self-pay | Admitting: Urology

## 2019-08-18 LAB — PSA: Prostate Specific Ag, Serum: 0.2 ng/mL (ref 0.0–4.0)

## 2019-08-20 ENCOUNTER — Other Ambulatory Visit: Payer: Self-pay

## 2019-08-20 DIAGNOSIS — Z8546 Personal history of malignant neoplasm of prostate: Secondary | ICD-10-CM

## 2019-09-04 ENCOUNTER — Other Ambulatory Visit: Payer: Self-pay

## 2019-09-04 ENCOUNTER — Ambulatory Visit (INDEPENDENT_AMBULATORY_CARE_PROVIDER_SITE_OTHER): Payer: Medicare PPO | Admitting: Physician Assistant

## 2019-09-04 ENCOUNTER — Encounter: Payer: Self-pay | Admitting: Physician Assistant

## 2019-09-04 VITALS — BP 172/75 | HR 81 | Ht 66.0 in | Wt 158.6 lb

## 2019-09-04 DIAGNOSIS — R300 Dysuria: Secondary | ICD-10-CM

## 2019-09-04 LAB — URINALYSIS, COMPLETE
Bilirubin, UA: NEGATIVE
Ketones, UA: NEGATIVE
Leukocytes,UA: NEGATIVE
Nitrite, UA: NEGATIVE
RBC, UA: NEGATIVE
Specific Gravity, UA: 1.02 (ref 1.005–1.030)
Urobilinogen, Ur: 0.2 mg/dL (ref 0.2–1.0)
pH, UA: 6 (ref 5.0–7.5)

## 2019-09-04 LAB — MICROSCOPIC EXAMINATION
Bacteria, UA: NONE SEEN
RBC, Urine: NONE SEEN /hpf (ref 0–2)

## 2019-09-04 NOTE — Progress Notes (Signed)
09/04/2019 10:42 AM   Chase Wallace 02-08-1941 [id number]  CC: Itching with urination  HPI: Chase Wallace is a 78 y.o. male who presents today for evaluation of possible UTI. He is an established [facility] patient who last saw Dr. Bernardo Heater on 08/17/2019 for annual follow-up with PMH prostate cancer s/p radical prostatectomy, low grade Ta urothelial carcinoma, and ED s/p penile prosthesis.  He presents today stating he had itching with urination that resolved on Friday. He denies dysuria, urgency, frequency, fevers, chills, nausea, vomiting, flank pain, and penile rashes.  In-office UA and microscopy today pan-negative.  PMH: Past Medical History:  Diagnosis Date  . Abnormal findings on cytological and histological examination of urine 06/04/2015  . Abnormal urine cytology 06/04/2015  . Bladder cancer ([place])   . Cervical radiculitis 12/30/2014  . Colon polyps   . DDD (degenerative disc disease), cervical 12/30/2014  . Diabetes mellitus without complication ([place])   . 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 Hemorrhoids   . History of bladder cancer 12/28/2012  . Hyperlipidemia   . Hypertension   . Male erectile dysfunction 12/28/2012  . Microscopic hematuria 06/03/2015  . Other cervical disc degeneration, unspecified cervical region 12/30/2014  . Personal history of prostate cancer 12/28/2012    Surgical History: Past Surgical History:  Procedure Laterality Date  . APPENDECTOMY    . CHOLECYSTECTOMY    . COLONOSCOPY WITH PROPOFOL N/A 11/14/2018   Procedure: COLONOSCOPY WITH PROPOFOL;  Surgeon: Lollie Sails, MD;  Location: [facility] ENDOSCOPY;  Service: Endoscopy;  Laterality: N/A;  . Laproscopic Prostatectomy    . Removal of Nasal Polyps      Home Medications:  Allergies as of 09/04/2019      Reactions   Nsaids Other (See Comments)   Elevated creatinine      Medication List       Accurate as of September 04, 2019 10:42 AM. If you have any questions, ask your  nurse or doctor.        amLODipine 10 MG tablet Commonly known as: NORVASC Take 10 mg by mouth.   atorvastatin 20 MG tablet Commonly known as: LIPITOR Take 20 mg by mouth.   azelastine 0.1 % nasal spray Commonly known as: ASTELIN   fluticasone 50 MCG/ACT nasal spray Commonly known as: FLONASE   glipiZIDE 5 MG tablet Commonly known as: GLUCOTROL Take 5 mg by mouth.   HYDROcodone-acetaminophen 5-325 MG tablet Commonly known as: NORCO/VICODIN 1/2-1 po bid   Influenza vac split quadrivalent PF 0.5 ML injection Commonly known as: FLUZONE HIGH-DOSE Fluzone High-Dose 2017-2018 (PF) 180 mcg/0.5 mL intramuscular syringe   lisinopril 20 MG tablet Commonly known as: ZESTRIL Take 20 mg by mouth.   Melatonin 1 MG Tabs Take by mouth.   metFORMIN 500 MG tablet Commonly known as: GLUCOPHAGE Take 500 mg by mouth.   omeprazole 20 MG capsule Commonly known as: PRILOSEC Take 20 mg by mouth.   triamcinolone 0.025 % cream Commonly known as: KENALOG Frequency:BID   Dosage:0.0     Instructions:  Note:Dose: 0.025 %       Allergies:  Allergies  Allergen Reactions  . Nsaids Other (See Comments)    Elevated creatinine    Family History: History reviewed. No pertinent family history.  Social History:   reports that he has quit smoking. He has never used smokeless tobacco. He reports that he does not drink alcohol or use drugs.  ROS: UROLOGY Frequent Urination?: No Hard to postpone urination?: No Burning/pain  with urination?: No Get up at night to urinate?: No Leakage of urine?: No Urine stream starts and stops?: No Trouble starting stream?: No Do you have to strain to urinate?: No Blood in urine?: No Urinary tract infection?: No Sexually transmitted disease?: No Injury to kidneys or bladder?: No Painful intercourse?: No Weak stream?: No Erection problems?: No Penile pain?: No  Gastrointestinal Nausea?: No Vomiting?: No Indigestion/heartburn?: No Diarrhea?:  No Constipation?: No  Constitutional Fever: No Night sweats?: No Weight loss?: No Fatigue?: No  Skin Skin rash/lesions?: No Itching?: No  Eyes Blurred vision?: No Double vision?: No  Ears/Nose/Throat Sore throat?: No Sinus problems?: No  Hematologic/Lymphatic Swollen glands?: No Easy bruising?: No  Cardiovascular Leg swelling?: No Chest pain?: No  Respiratory Cough?: No Shortness of breath?: No  Endocrine Excessive thirst?: No  Musculoskeletal Back pain?: No Joint pain?: No  Neurological Headaches?: No Dizziness?: No  Psychologic Depression?: No Anxiety?: No  Physical Exam: BP (!) 172/75   Pulse 81   Ht 5\' 6"  (1.676 m)   Wt 158 lb 9.6 oz (71.9 kg)   BMI 25.60 kg/m   Constitutional:  Alert and oriented, no acute distress, nontoxic appearing HEENT: [place], AT Cardiovascular: No clubbing, cyanosis, or edema Respiratory: Normal respiratory effort, no increased work of breathing Skin: No rashes, bruises or suspicious lesions Neurologic: Grossly intact, no focal deficits, moving all 4 extremities Psychiatric: Normal mood and affect  Laboratory Data: Results for orders placed or performed in visit on 09/04/19  Microscopic Examination   URINE  Result Value Ref Range   WBC, UA 0-5 0 - 5 /hpf   RBC None seen 0 - 2 /hpf   Epithelial Cells (non renal) 0-10 0 - 10 /hpf   Bacteria, UA None seen None seen/Few  Urinalysis, Complete  Result Value Ref Range   Specific Gravity, UA 1.020 1.005 - 1.030   pH, UA 6.0 5.0 - 7.5   Color, UA Yellow Yellow   Appearance Ur Clear Clear   Leukocytes,UA Negative Negative   Protein,UA 1+ (A) Negative/Trace   Glucose, UA Trace (A) Negative   Ketones, UA Negative Negative   RBC, UA Negative Negative   Bilirubin, UA Negative Negative   Urobilinogen, Ur 0.2 0.2 - 1.0 mg/dL   Nitrite, UA Negative Negative   Microscopic Examination See below:    Assessment & Plan:   1. Dysuria Resolved, patient asymptomatic, clear UA.  No intervention indicated. Will not send for culture. - Urinalysis, Complete  Return if symptoms worsen or fail to improve.  Debroah Loop, PA-C  [facility] [address], [place], [place] [address] [phone]

## 2019-09-26 ENCOUNTER — Telehealth: Payer: Self-pay | Admitting: Urology

## 2019-09-26 NOTE — Telephone Encounter (Signed)
Pt wants to know if it is necessary to have his PSA drawn again 12/14.

## 2019-09-26 NOTE — Telephone Encounter (Signed)
Called pt informed him that repeat PSA was needed, pt gave verbal understanding.

## 2019-10-01 ENCOUNTER — Other Ambulatory Visit: Payer: Medicare PPO

## 2019-10-01 ENCOUNTER — Other Ambulatory Visit: Payer: Self-pay

## 2019-10-01 DIAGNOSIS — Z8546 Personal history of malignant neoplasm of prostate: Secondary | ICD-10-CM

## 2019-10-02 LAB — PSA: Prostate Specific Ag, Serum: 0.2 ng/mL (ref 0.0–4.0)

## 2019-10-03 ENCOUNTER — Telehealth: Payer: Self-pay

## 2019-10-03 DIAGNOSIS — Z8546 Personal history of malignant neoplasm of prostate: Secondary | ICD-10-CM

## 2019-10-03 NOTE — Telephone Encounter (Signed)
Called pt informed him of the information below. Pt gave verbal understanding. PSA ordered. Lab appt scheduled.  

## 2019-10-03 NOTE — Telephone Encounter (Signed)
-----   Message from Abbie Sons, MD sent at 10/03/2019 11:17 AM EST ----- PSA was 0.2.  Recommend a recheck in 3-4 months.  Lab visit only

## 2020-02-01 ENCOUNTER — Other Ambulatory Visit: Payer: Medicare PPO

## 2020-02-01 ENCOUNTER — Other Ambulatory Visit: Payer: Self-pay

## 2020-02-01 DIAGNOSIS — Z8546 Personal history of malignant neoplasm of prostate: Secondary | ICD-10-CM

## 2020-02-02 ENCOUNTER — Encounter: Payer: Self-pay | Admitting: Urology

## 2020-02-02 LAB — PSA: Prostate Specific Ag, Serum: 0.3 ng/mL (ref 0.0–4.0)

## 2020-02-21 ENCOUNTER — Encounter: Payer: Self-pay | Admitting: Urology

## 2020-02-21 ENCOUNTER — Other Ambulatory Visit: Payer: Self-pay

## 2020-02-21 ENCOUNTER — Ambulatory Visit: Payer: Medicare PPO | Admitting: Urology

## 2020-02-21 VITALS — BP 187/90 | HR 98 | Ht 66.0 in | Wt 150.0 lb

## 2020-02-21 DIAGNOSIS — C61 Malignant neoplasm of prostate: Secondary | ICD-10-CM | POA: Diagnosis not present

## 2020-02-22 ENCOUNTER — Encounter: Payer: Self-pay | Admitting: Urology

## 2020-02-22 NOTE — Progress Notes (Signed)
02/21/2020 7:14 AM   Chase Wallace July 13, 1941 [id number]  Referring provider: [place] [address] [place],  [place] [address]  Chief Complaint  Patient presents with  . Follow-up    Urologic history: 1. T2Gleason 4+5 prostate cancer status post radical prostatectomy 2000; PSA undetectable since surgery  2.History low-grade Taurothelial carcinoma -Last recurrence 1999 -2013 he had urine cytologies which showed atypical cells. This has been extensively evaluated with multiple biopsies including bluelight cystoscopy. 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 male with recent PSA recurrence.  His PSA has been undetectable since his radical prostatectomy in 2000 until October 2020 when it was 0.2.  Repeat December 2020 was 0.2 and April 2021 0.3.  No voiding complaints.  Denies dysuria or gross hematuria.  No flank, abdominal or pelvic pain.   PMH: Past Medical History:  Diagnosis Date  . Abnormal findings on cytological and histological examination of urine 06/04/2015  . Abnormal urine cytology 06/04/2015  . Bladder cancer ([place])   . Cervical radiculitis 12/30/2014  . Colon polyps   . DDD (degenerative disc disease), cervical 12/30/2014  . Diabetes mellitus without complication ([place])   . 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 Hemorrhoids   . History of bladder cancer 12/28/2012  . Hyperlipidemia   . Hypertension   . Male erectile dysfunction 12/28/2012  . Microscopic hematuria 06/03/2015  . Other cervical disc degeneration, unspecified cervical region 12/30/2014  . Personal history of prostate cancer 12/28/2012    Surgical History: Past Surgical History:  Procedure Laterality Date  .  APPENDECTOMY    . CHOLECYSTECTOMY    . COLONOSCOPY WITH PROPOFOL N/A 11/14/2018   Procedure: COLONOSCOPY WITH PROPOFOL;  Surgeon: Lollie Sails, MD;  Location: [facility] ENDOSCOPY;  Service: Endoscopy;  Laterality: N/A;  . Laproscopic Prostatectomy    . Removal of Nasal Polyps      Home Medications:  Allergies as of 02/21/2020      Reactions   Nsaids Other (See Comments)   Elevated creatinine      Medication List       Accurate as of Feb 21, 2020 11:59 PM. If you have any questions, ask your nurse or doctor.        amLODipine 10 MG tablet Commonly known as: NORVASC Take 10 mg by mouth.   atorvastatin 20 MG tablet Commonly known as: LIPITOR Take 20 mg by mouth.   azelastine 0.1 % nasal spray Commonly known as: ASTELIN   fluticasone 50 MCG/ACT nasal spray Commonly known as: FLONASE   glipiZIDE 5 MG tablet Commonly known as: GLUCOTROL Take 5 mg by mouth.   HYDROcodone-acetaminophen 5-325 MG tablet Commonly known as: NORCO/VICODIN 1/2-1 po bid   Influenza vac split quadrivalent PF 0.5 ML injection Commonly known as: FLUZONE HIGH-DOSE Fluzone High-Dose 2017-2018 (PF) 180 mcg/0.5 mL intramuscular syringe   lisinopril 20 MG tablet Commonly known as: ZESTRIL Take 20 mg by mouth.   melatonin 1 MG Tabs tablet Take by mouth.   metFORMIN 500 MG tablet Commonly known as: GLUCOPHAGE Take 500 mg by mouth.   omeprazole 20 MG capsule Commonly known as: PRILOSEC Take 20 mg by mouth.   triamcinolone 0.025 % cream Commonly known as: KENALOG Frequency:BID   Dosage:0.0     Instructions:  Note:Dose: 0.025 %  Allergies:  Allergies  Allergen Reactions  . Nsaids Other (See Comments)    Elevated creatinine    Family History: No family history on file.  Social History:  reports that he has quit smoking. He has never used smokeless tobacco. He reports that he does not drink alcohol or use drugs.   Physical Exam: BP (!) 187/90   Pulse 98   Ht 5\' 6"  (1.676 m)    Wt 150 lb (68 kg)   BMI 24.21 kg/m   Constitutional:  Alert and oriented, No acute distress. HEENT: [place] AT, moist mucus membranes.  Trachea midline, no masses. Cardiovascular: No clubbing, cyanosis, or edema. Respiratory: Normal respiratory effort, no increased work of breathing. Skin: No rashes, bruises or suspicious lesions. Neurologic: Grossly intact, no focal deficits, moving all 4 extremities. Psychiatric: Normal mood and affect.   Assessment & Plan:   79 y.o. male with history of high risk prostate cancer status post radical prostatectomy in 2000 with recent PSA recurrence.  Recommend scheduling CT abdomen/pelvis.  If scan negative will refer to oncology to discuss options of surveillance, external radiation and ADT.  Unlikely that a bone scan will be abnormal with a PSA level this low; consider Axumin scan.   Abbie Sons, [place] [address], [place], [place] [address] [phone]

## 2020-03-27 ENCOUNTER — Other Ambulatory Visit: Payer: Self-pay | Admitting: Urology

## 2020-03-27 DIAGNOSIS — C61 Malignant neoplasm of prostate: Secondary | ICD-10-CM

## 2020-04-04 ENCOUNTER — Other Ambulatory Visit: Payer: Self-pay

## 2020-04-04 ENCOUNTER — Ambulatory Visit
Admission: RE | Admit: 2020-04-04 | Discharge: 2020-04-04 | Disposition: A | Discharge status: Home / Self Care | Payer: Medicare PPO | Source: Ambulatory Visit | Attending: Urology | Admitting: Urology

## 2020-04-04 DIAGNOSIS — C61 Malignant neoplasm of prostate: Secondary | ICD-10-CM | POA: Insufficient documentation

## 2020-04-04 LAB — POCT I-STAT CREATININE: Creatinine, Ser: 1.1 mg/dL (ref 0.61–1.24)

## 2020-04-04 MED ORDER — IOHEXOL 300 MG/ML  SOLN
100.0000 mL | Freq: Once | INTRAMUSCULAR | Status: AC | PRN
  Administered 2020-04-04: 100 mL via INTRAVENOUS

## 2020-04-08 ENCOUNTER — Encounter: Payer: Self-pay | Admitting: Urology

## 2020-04-08 ENCOUNTER — Other Ambulatory Visit: Payer: Self-pay | Admitting: Urology

## 2020-04-08 DIAGNOSIS — C61 Malignant neoplasm of prostate: Secondary | ICD-10-CM

## 2020-04-14 ENCOUNTER — Ambulatory Visit: Payer: Medicare PPO | Admitting: Oncology

## 2020-04-14 ENCOUNTER — Other Ambulatory Visit: Payer: Medicare PPO

## 2020-04-14 ENCOUNTER — Inpatient Hospital Stay: Payer: Medicare PPO | Attending: Oncology | Admitting: Oncology

## 2020-04-14 ENCOUNTER — Inpatient Hospital Stay: Payer: Medicare PPO

## 2020-04-14 ENCOUNTER — Other Ambulatory Visit: Payer: Self-pay

## 2020-04-14 ENCOUNTER — Encounter: Payer: Self-pay | Admitting: Oncology

## 2020-04-14 VITALS — BP 164/78 | HR 96 | Resp 20 | Wt 154.2 lb

## 2020-04-14 DIAGNOSIS — Z7984 Long term (current) use of oral hypoglycemic drugs: Secondary | ICD-10-CM | POA: Diagnosis not present

## 2020-04-14 DIAGNOSIS — E785 Hyperlipidemia, unspecified: Secondary | ICD-10-CM | POA: Insufficient documentation

## 2020-04-14 DIAGNOSIS — Z9079 Acquired absence of other genital organ(s): Secondary | ICD-10-CM | POA: Diagnosis not present

## 2020-04-14 DIAGNOSIS — I10 Essential (primary) hypertension: Secondary | ICD-10-CM | POA: Diagnosis not present

## 2020-04-14 DIAGNOSIS — Z79899 Other long term (current) drug therapy: Secondary | ICD-10-CM

## 2020-04-14 DIAGNOSIS — E119 Type 2 diabetes mellitus without complications: Secondary | ICD-10-CM | POA: Insufficient documentation

## 2020-04-14 DIAGNOSIS — Z8551 Personal history of malignant neoplasm of bladder: Secondary | ICD-10-CM | POA: Diagnosis not present

## 2020-04-14 DIAGNOSIS — C61 Malignant neoplasm of prostate: Secondary | ICD-10-CM | POA: Diagnosis present

## 2020-04-14 DIAGNOSIS — Z87891 Personal history of nicotine dependence: Secondary | ICD-10-CM | POA: Insufficient documentation

## 2020-04-14 LAB — CBC WITH DIFFERENTIAL/PLATELET
Abs Immature Granulocytes: 0.04 10*3/uL (ref 0.00–0.07)
Basophils Absolute: 0 10*3/uL (ref 0.0–0.1)
Basophils Relative: 0 %
Eosinophils Absolute: 0.3 10*3/uL (ref 0.0–0.5)
Eosinophils Relative: 4 %
HCT: 41.3 % (ref 39.0–52.0)
Hemoglobin: 13.5 g/dL (ref 13.0–17.0)
Immature Granulocytes: 0 %
Lymphocytes Relative: 20 %
Lymphs Abs: 1.9 10*3/uL (ref 0.7–4.0)
MCH: 26.4 pg (ref 26.0–34.0)
MCHC: 32.7 g/dL (ref 30.0–36.0)
MCV: 80.7 fL (ref 80.0–100.0)
Monocytes Absolute: 0.9 10*3/uL (ref 0.1–1.0)
Monocytes Relative: 10 %
Neutro Abs: 6.4 10*3/uL (ref 1.7–7.7)
Neutrophils Relative %: 66 %
Platelets: 240 10*3/uL (ref 150–400)
RBC: 5.12 MIL/uL (ref 4.22–5.81)
RDW: 15.5 % (ref 11.5–15.5)
WBC: 9.6 10*3/uL (ref 4.0–10.5)
nRBC: 0 % (ref 0.0–0.2)

## 2020-04-14 LAB — COMPREHENSIVE METABOLIC PANEL
ALT: 30 U/L (ref 0–44)
AST: 32 U/L (ref 15–41)
Albumin: 4.3 g/dL (ref 3.5–5.0)
Alkaline Phosphatase: 30 U/L — ABNORMAL LOW (ref 38–126)
Anion gap: 13 (ref 5–15)
BUN: 17 mg/dL (ref 8–23)
CO2: 25 mmol/L (ref 22–32)
Calcium: 9.5 mg/dL (ref 8.9–10.3)
Chloride: 104 mmol/L (ref 98–111)
Creatinine, Ser: 1.08 mg/dL (ref 0.61–1.24)
GFR calc Af Amer: >60 mL/min (ref >60)
GFR calc non Af Amer: >60 mL/min (ref >60)
Glucose, Bld: 115 mg/dL — ABNORMAL HIGH (ref 70–99)
Potassium: 4.2 mmol/L (ref 3.5–5.1)
Sodium: 142 mmol/L (ref 135–145)
Total Bilirubin: 1.1 mg/dL (ref 0.3–1.2)
Total Protein: 8.3 g/dL — ABNORMAL HIGH (ref 6.5–8.1)

## 2020-04-14 LAB — PSA: Prostatic Specific Antigen: 0.17 ng/mL (ref 0.00–4.00)

## 2020-04-14 NOTE — Progress Notes (Signed)
Patient here today for initial evaluation regarding prostate cancer.  

## 2020-04-14 NOTE — Progress Notes (Signed)
Hematology/Oncology Consult note [facility] [phone] [phone]  Patient Care Team: [place] as PCP - General (General Practice)   Name of the patient: Chase Wallace  [id number]  [birth year]    Reason for referral-history of prostate cancer   Referring physician-Dr. Bernardo Heater  Date of visit: 04/14/20   History of presenting illness-patient is a 79 year old male who was diagnosed with Gleason's 4+5 prostate cancer T2 disease in 2000.  He is s/p radical prostatectomy.  His PSA was undetectable since surgery.  Also has a history of low-grade urothelial carcinoma T1 a with last recurrence in 1999.  In 2013 he had urine cytologies which showed atypical cells but subsequent work-up with cystoscopies and biopsies did not reveal any malignancy.  He undergoes yearly surveillance cystoscopy.  He also has penile prosthesis for erectile dysfunction since early 2000.Patient has been getting his PSA monitored through urology his PSA was undetectable in October 2019 followed by a value of 0.2 in October and December 2020.  Noted to have a value of 0.3 in April 2021.  Currently patient is not on ADT.  CT abdomen did not show any evidence of malignancy but showed possible cirrhosis.  Patient did not have any known history of cirrhosis.  He does not drink alcohol.  He was exposed to agent orange in the past. Patient is also service-connected to [place] [facility].  ECOG PS- 1  Pain scale- 0   Review of systems- Review of Systems  Constitutional: Positive for malaise/fatigue. Negative for chills, fever and weight loss.  HENT: Negative for congestion, ear discharge and nosebleeds.   Eyes: Negative for blurred vision.  Respiratory: Negative for cough, hemoptysis, sputum production, shortness of breath and wheezing.   Cardiovascular: Negative for chest pain, palpitations, orthopnea and claudication.  Gastrointestinal: Negative for abdominal pain, blood in  stool, constipation, diarrhea, heartburn, melena, nausea and vomiting.  Genitourinary: Negative for dysuria, flank pain, frequency, hematuria and urgency.  Musculoskeletal: Negative for back pain, joint pain and myalgias.  Skin: Negative for rash.  Neurological: Negative for dizziness, tingling, focal weakness, seizures, weakness and headaches.  Endo/Heme/Allergies: Does not bruise/bleed easily.  Psychiatric/Behavioral: Negative for depression and suicidal ideas. The patient does not have insomnia.     Allergies  Allergen Reactions  . Nsaids Other (See Comments)    Elevated creatinine    Patient Active Problem List   Diagnosis Date Noted  . History of colon polyps 11/15/2018  . Cervical radiculitis 12/30/2014  . DDD (degenerative disc disease), cervical 12/30/2014  . Other cervical disc degeneration, unspecified cervical region 12/30/2014  . Male erectile dysfunction 12/28/2012  . History of bladder cancer 12/28/2012  . Personal history of prostate cancer 12/28/2012     Past Medical History:  Diagnosis Date  . Abnormal findings on cytological and histological examination of urine 06/04/2015  . Abnormal urine cytology 06/04/2015  . Bladder cancer ([place])   . Cervical radiculitis 12/30/2014  . Colon polyps   . DDD (degenerative disc disease), cervical 12/30/2014  . Diabetes mellitus without complication ([place])   . 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 Hemorrhoids   . History of bladder cancer 12/28/2012  . Hyperlipidemia   . Hypertension   . Male erectile dysfunction 12/28/2012  . Microscopic hematuria 06/03/2015  . Other cervical disc degeneration, unspecified cervical region 12/30/2014  . Personal history of prostate cancer 12/28/2012     Past Surgical History:  Procedure Laterality Date  . APPENDECTOMY    .  CHOLECYSTECTOMY    . COLONOSCOPY WITH PROPOFOL N/A 11/14/2018   Procedure: COLONOSCOPY WITH PROPOFOL;  Surgeon: Lollie Sails, MD;  [facility]  ENDOSCOPY;  Service: Endoscopy;  Laterality: N/A;  . Laproscopic Prostatectomy    . Removal of Nasal Polyps      Social History   Socioeconomic History  . Marital status: Married    Spouse name: Not on file  . Number of children: Not on file  . Years of education: Not on file  . Highest education level: Not on file  Occupational History  . Not on file  Tobacco Use  . Smoking status: Former Research scientist (life sciences)  . Smokeless tobacco: Never Used  Vaping Use  . Vaping Use: Never used  Substance and Sexual Activity  . Alcohol use: Never  . Drug use: Never  . Sexual activity: Yes    Birth control/protection: None  Other Topics Concern  . Not on file  Social History Narrative  . Not on file   Social Determinants of Health   Financial Resource Strain:   . Difficulty of Paying Living Expenses:   Food Insecurity:   . Worried About Charity fundraiser in the Last Year:   . Arboriculturist in the Last Year:   Transportation Needs:   . Film/video editor (Medical):   Marland Kitchen Lack of Transportation (Non-Medical):   Physical Activity:   . Days of Exercise per Week:   . Minutes of Exercise per Session:   Stress:   . Feeling of Stress :   Social Connections:   . Frequency of Communication with Friends and Family:   . Frequency of Social Gatherings with Friends and Family:   . Attends Religious Services:   . Active Member of Clubs or Organizations:   . Attends Archivist Meetings:   Marland Kitchen Marital Status:   Intimate Partner Violence:   . Fear of Current or Ex-Partner:   . Emotionally Abused:   Marland Kitchen Physically Abused:   . Sexually Abused:      No family history on file.   Current Outpatient Medications:  .  amLODipine (NORVASC) 10 MG tablet, Take 10 mg by mouth., Disp: , Rfl:  .  atorvastatin (LIPITOR) 20 MG tablet, Take 20 mg by mouth., Disp: , Rfl:  .  azelastine (ASTELIN) 0.1 % nasal spray, , Disp: , Rfl:  .  fluticasone (FLONASE) 50 MCG/ACT nasal spray, , Disp: , Rfl:  .   glipiZIDE (GLUCOTROL) 5 MG tablet, Take 5 mg by mouth., Disp: , Rfl:  .  HYDROcodone-acetaminophen (NORCO/VICODIN) 5-325 MG tablet, 1/2-1 po bid, Disp: , Rfl:  .  lisinopril (PRINIVIL,ZESTRIL) 20 MG tablet, Take 20 mg by mouth., Disp: , Rfl:  .  Melatonin 1 MG TABS, Take by mouth., Disp: , Rfl:  .  metFORMIN (GLUCOPHAGE) 500 MG tablet, Take 500 mg by mouth., Disp: , Rfl:  .  omeprazole (PRILOSEC) 20 MG capsule, Take 20 mg by mouth., Disp: , Rfl:    Physical exam:  Vitals:   04/14/20 1103 04/14/20 1104  BP:  (!) 164/78  Pulse:  96  Resp:  20  Weight: 154 lb 3.2 oz (69.9 kg)    Physical Exam Constitutional:      General: He is not in acute distress. Cardiovascular:     Rate and Rhythm: Normal rate and regular rhythm.     Heart sounds: Normal heart sounds.  Pulmonary:     Effort: Pulmonary effort is normal.     Breath  sounds: Normal breath sounds.  Abdominal:     General: Bowel sounds are normal.     Palpations: Abdomen is soft.  Skin:    General: Skin is warm and dry.  Neurological:     Mental Status: He is alert and oriented to person, place, and time.        CMP Latest Ref Rng & Units 04/04/2020  Glucose 65 - 99 mg/dL -  BUN 7 - 18 mg/dL -  Creatinine 0.61 - 1.24 mg/dL 1.10  Sodium 136 - 145 mmol/L -  Potassium 3.5 - 5.1 mmol/L -  Chloride 98 - 107 mmol/L -  CO2 21 - 32 mmol/L -  Calcium 8.5 - 10.1 mg/dL -  Total Protein 6.4 - 8.2 g/dL -  Total Bilirubin 0.2 - 1.0 mg/dL -  Alkaline Phos 50 - 136 Unit/L -  AST 15 - 37 Unit/L -  ALT 12 - 78 U/L -   CBC Latest Ref Rng & Units 01/27/2013  WBC 3.8 - 10.6 x10 3/mm 3 10.2  Hemoglobin 13.0 - 18.0 g/dL 12.6(L)  Hematocrit 40.0 - 52.0 % 37.3(L)  Platelets 150 - 440 x10 3/mm 3 220    No images are attached to the encounter.  CT Abdomen Pelvis W Contrast  Result Date: 04/07/2020 CLINICAL DATA:  Prostatectomy for chemotherapy. Ex-smoker, quitting 25 years ago. Elevated PSA. EXAM: CT ABDOMEN AND PELVIS WITH CONTRAST  TECHNIQUE: Multidetector CT imaging of the abdomen and pelvis was performed using the standard protocol following bolus administration of intravenous contrast. CONTRAST:  180mL OMNIPAQUE IOHEXOL 300 MG/ML SOLN 100 cc Omnipaque 350 COMPARISON:  01/19/16. FINDINGS: Lower chest: Clear lung bases. Normal heart size without pericardial or pleural effusion. Right coronary artery atherosclerosis. Hepatobiliary: Irregular hepatic capsule. No focal liver lesion. Cholecystectomy, without biliary ductal dilatation. Pancreas: Normal, without mass or ductal dilatation. Spleen: Normal in size, without focal abnormality. Adrenals/Urinary Tract: Normal adrenal glands. Bilateral too small to characterize renal lesions. No hydronephrosis. Normal urinary bladder. Stomach/Bowel: Proximal gastric underdistention. Descending duodenal diverticulum. Otherwise normal small bowel. Normal terminal ileum. Vascular/Lymphatic: Advanced aortic and branch vessel atherosclerosis. No abdominopelvic adenopathy. Reproductive: Prostatectomy. No locally recurrent disease. Penile pump. Right pelvic reservoir. Other: Ventral fat containing abdominal wall laxity. Musculoskeletal: Degenerate disc disease at L2-3. No worrisome osseous lesions IMPRESSION: 1. No acute process or evidence of metastatic disease in the abdomen or pelvis. 2. Irregular hepatic capsule, suspicious for cirrhosis. 3. Prostatectomy, without recurrent disease. 4. Aortic Atherosclerosis (ICD10-I70.0). Coronary artery atherosclerosis. Electronically Signed   By: Abigail Miyamoto M.D.   On: 04/07/2020 10:49    Assessment and plan- Patient is a 79 y.o. male with prior history of T2 prostate cancer s/p radical prostatectomy now with biochemical recurrence  Patient's PSA level was undetectable in 2019 and is beginning to show slow rise from less than 0.1-0 0.1-0 0.2-0.3 most recently in April 2021.At this time  I am inclined to monitor this conservatively.  If his PSA continues to rise and  goes beyond the 0.4 he would need to be considered for salvage radiation treatment to the prostatic bed.  Check CBC with differential CMP and PSA today  If there is a continued rise of his PSA despite salvage radiation treatment additionally ADT can be considered at that time.  Given that his PSA is so low I will hold off on bone scan at this time  I will see him back in 3 months with CBC with differential CMP and PSA   Thank you for this  kind referral and the opportunity to participate in the care of this patient   Visit Diagnosis 1. Prostate cancer [facility])     Dr. Randa Evens, MD, MPH [facility] at [facility] [phone] 04/14/2020 12:56 PM

## 2020-04-15 ENCOUNTER — Telehealth: Payer: Self-pay

## 2020-04-15 NOTE — Telephone Encounter (Signed)
-----   Message from Sindy Guadeloupe, MD sent at 04/15/2020  8:21 AM EDT ----- Please let patient know- his psa is 0.17- lower as compared to prior values of 0.2 and 0.3. hold off on radiation at this time. Check psa again in 3 months and decide where to go from there. Thanks, Presenter, broadcasting

## 2020-04-15 NOTE — Telephone Encounter (Signed)
Spoke with patient and gave him his result and recommendation. SJC

## 2020-06-18 ENCOUNTER — Telehealth: Payer: Self-pay | Admitting: Oncology

## 2020-06-18 NOTE — Telephone Encounter (Signed)
Patient phoned on this date and stated that he wanted to cancel his appts at the [facility] as he would be seeing MD at the [facility] instead. Appts cancelled per patient's request.

## 2020-07-15 ENCOUNTER — Other Ambulatory Visit: Payer: Medicare PPO

## 2020-07-15 ENCOUNTER — Ambulatory Visit: Payer: Medicare PPO | Admitting: Oncology

## 2020-08-07 ENCOUNTER — Encounter: Payer: Self-pay | Admitting: Urology

## 2020-08-18 ENCOUNTER — Ambulatory Visit: Payer: Medicare PPO | Admitting: Urology

## 2020-10-03 ENCOUNTER — Encounter: Payer: Self-pay | Admitting: Oncology

## 2020-10-03 NOTE — Telephone Encounter (Signed)
1st or 2nd week of jan with labs and see me and dr Baruch Gouty same day

## 2020-10-28 ENCOUNTER — Telehealth: Payer: Self-pay | Admitting: Oncology

## 2020-10-28 NOTE — Telephone Encounter (Signed)
Patients wife called to cancel his appointments scheduled on 10/31/20 for labs and md.  Routing to scheduler for follow up.

## 2020-10-31 ENCOUNTER — Inpatient Hospital Stay: Payer: Medicare PPO

## 2020-10-31 ENCOUNTER — Ambulatory Visit: Payer: Medicare PPO | Admitting: Radiation Oncology

## 2020-10-31 ENCOUNTER — Inpatient Hospital Stay: Payer: Medicare PPO | Admitting: Oncology

## 2020-12-16 ENCOUNTER — Encounter: Payer: Self-pay | Admitting: Urology

## 2021-01-22 ENCOUNTER — Other Ambulatory Visit: Payer: Self-pay

## 2021-01-22 ENCOUNTER — Encounter: Payer: Self-pay | Admitting: Urology

## 2021-01-22 ENCOUNTER — Ambulatory Visit: Payer: Medicare PPO | Admitting: Urology

## 2021-01-22 VITALS — BP 169/81 | HR 121 | Ht 66.0 in | Wt 159.0 lb

## 2021-01-22 DIAGNOSIS — C679 Malignant neoplasm of bladder, unspecified: Secondary | ICD-10-CM

## 2021-01-22 DIAGNOSIS — C61 Malignant neoplasm of prostate: Secondary | ICD-10-CM | POA: Diagnosis not present

## 2021-01-22 DIAGNOSIS — R39198 Other difficulties with micturition: Secondary | ICD-10-CM | POA: Diagnosis not present

## 2021-01-22 LAB — BLADDER SCAN AMB NON-IMAGING: Scan Result: 46

## 2021-01-22 NOTE — Patient Instructions (Signed)

## 2021-01-22 NOTE — Progress Notes (Signed)
01/22/2021 1:47 PM   DAEQUAN Wallace 1941-09-13 [id number]  Referring provider: [facility] [address] [place],  [place] [address]  Chief Complaint  Patient presents with  . Urinary Retention   Urological history: 1. T2 Gleason 4+5 prostate cancer status post radical prostatectomy 2000; PSA 0.17 in 03/2020  2. History low-grade Ta urothelial carcinoma -Last recurrence 1999 -2013 he had urine cytologies which showed atypical cells. This has been extensively evaluated with multiple biopsies including bluelight cystoscopy. He had one biopsy in 2015 which showed PULMP. No evidence of upper tract abnormalities on CT urogram, retrograde pyelogram and selective cytologies.  He elected to discontinue annual surveillance cystoscopy  3.  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 male who presents today with concerns of difficulty with urinating.    He finished his radiation last week and started having difficulty urinating with a weaker stream.  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  He was prescribed Flomax, but he just started it yesterday.    Patient denies any modifying or aggravating factors.  Patient denies any gross hematuria, dysuria or suprapubic/flank pain.  Patient denies any fevers, chills, nausea or vomiting.   PVR 46 mL   PMH: Past Medical History:  Diagnosis Date  . Abnormal findings on cytological and histological examination of urine 06/04/2015  . Abnormal urine cytology 06/04/2015  . Bladder cancer ([place])   . Cervical radiculitis 12/30/2014  . Colon polyps   . DDD (degenerative disc disease), cervical 12/30/2014  . Diabetes mellitus without complication ([place])   . 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 Hemorrhoids   . History of bladder cancer 12/28/2012  . Hyperlipidemia   . Hypertension   . Male erectile dysfunction  12/28/2012  . Microscopic hematuria 06/03/2015  . Other cervical disc degeneration, unspecified cervical region 12/30/2014  . Personal history of prostate cancer 12/28/2012    Surgical History: Past Surgical History:  Procedure Laterality Date  . APPENDECTOMY    . CHOLECYSTECTOMY    . COLONOSCOPY WITH PROPOFOL N/A 11/14/2018   Procedure: COLONOSCOPY WITH PROPOFOL;  Surgeon: Lollie Sails, MD;  Location: [facility] ENDOSCOPY;  Service: Endoscopy;  Laterality: N/A;  . Laproscopic Prostatectomy    . Removal of Nasal Polyps      Home Medications:  Allergies as of 01/22/2021      Reactions   Nsaids Other (See Comments)   Elevated creatinine      Medication List       Accurate as of January 22, 2021  1:47 PM. If you have any questions, ask your nurse or doctor.        amLODipine 10 MG tablet Commonly known as: NORVASC Take 10 mg by mouth.   atorvastatin 20 MG tablet Commonly known as: LIPITOR Take 20 mg by mouth.   azelastine 0.1 % nasal spray Commonly known as: ASTELIN   fluticasone 50 MCG/ACT nasal spray Commonly known as: FLONASE   glipiZIDE 5 MG tablet Commonly known as: GLUCOTROL Take 5 mg by mouth.   HYDROcodone-acetaminophen 5-325 MG tablet Commonly known as: NORCO/VICODIN 1/2-1 po bid   lisinopril 20 MG tablet Commonly known as: ZESTRIL Take 20 mg by mouth.   melatonin 1 MG Tabs tablet Take by mouth.   metFORMIN 500 MG tablet Commonly known as: GLUCOPHAGE Take 500 mg by mouth.   omeprazole 20 MG capsule  Commonly known as: PRILOSEC Take 20 mg by mouth.   tamsulosin 0.4 MG Caps capsule Commonly known as: FLOMAX Take 0.4 mg by mouth daily.       Allergies:  Allergies  Allergen Reactions  . Nsaids Other (See Comments)    Elevated creatinine    Family History: No family history on file.  Social History:  reports that he has quit smoking. He has never used smokeless tobacco. He reports that he does not drink alcohol and does not use  drugs.  ROS: Pertinent ROS in HPI  Physical Exam: BP (!) 169/81   Pulse (!) 121   Ht 5\' 6"  (1.676 m)   Wt 159 lb (72.1 kg)   BMI 25.66 kg/m   Constitutional:  Well nourished. Alert and oriented, No acute distress. HEENT: [place] AT, mask in place.  Trachea midline Cardiovascular: No clubbing, cyanosis, or edema. Respiratory: Normal respiratory effort, no increased work of breathing. GU: No CVA tenderness.  No bladder fullness or masses.  Patient with circumcised phallus.  Urethral meatus is patent.  No penile discharge. No penile lesions or rashes. Scrotum without lesions, cysts, rashes and/or edema.  Testicles are located scrotally bilaterally. No masses are appreciated in the testicles. Left and right epididymis are normal.  Penile cylinders and pump in place.  No signs of erosion.  Neurologic: Grossly intact, no focal deficits, moving all 4 extremities. Psychiatric: Normal mood and affect.  Laboratory Data: Component     Latest Ref Rng & Units 04/14/2020  WBC     4.0 - 10.5 K/uL 9.6  RBC     4.22 - 5.81 MIL/uL 5.12  Hemoglobin     13.0 - 17.0 g/dL 13.5  HCT     39.0 - 52.0 % 41.3  MCV     80.0 - 100.0 fL 80.7  MCH     26.0 - 34.0 pg 26.4  MCHC     30.0 - 36.0 g/dL 32.7  RDW     11.5 - 15.5 % 15.5  Platelets     150 - 400 K/uL 240  nRBC     0.0 - 0.2 % 0.0  Neutrophils     % 66  NEUT#     1.7 - 7.7 K/uL 6.4  Lymphocytes     % 20  Lymphocyte #     0.7 - 4.0 K/uL 1.9  Monocytes Relative     % 10  Monocyte #     0.1 - 1.0 K/uL 0.9  Eosinophil     % 4  Eosinophils Absolute     0.0 - 0.5 K/uL 0.3  Basophil     % 0  Basophils Absolute     0.0 - 0.1 K/uL 0.0  Immature Granulocytes     % 0  Abs Immature Granulocytes     0.00 - 0.07 K/uL 0.04  WBC, UA     0 - 5 /hpf   Epithelial Cells (non renal)     0 - 10 /hpf   Casts     None seen /lpf   Cast Type     N/A   Bacteria, UA     None seen/Few    Component     Latest Ref Rng & Units 04/14/2020  Sodium      135 - 145 mmol/L 142  Potassium     3.5 - 5.1 mmol/L 4.2  Chloride     98 - 111 mmol/L 104  CO2     22 -  32 mmol/L 25  Glucose     70 - 99 mg/dL 115 (H)  BUN     8 - 23 mg/dL 17  Creatinine     0.61 - 1.24 mg/dL 1.08  Calcium     8.9 - 10.3 mg/dL 9.5  Total Protein     6.5 - 8.1 g/dL 8.3 (H)  Albumin     3.5 - 5.0 g/dL 4.3  AST     15 - 41 U/L 32  ALT     0 - 44 U/L 30  Alkaline Phosphatase     38 - 126 U/L 30 (L)  Total Bilirubin     0.3 - 1.2 mg/dL 1.1  GFR, Est Non African American     >60 mL/min >60  GFR, Est African American     >60 mL/min >60  Anion gap     5 - 15 13   Component     Latest Ref Rng & Units 04/14/2020  Prostatic Specific Antigen     0.00 - 4.00 ng/mL 0.17  I have reviewed the labs.   Pertinent Imaging: Results for Raimer, Antonios H "BUDDY" (MRN [id number]) as of 01/22/2021 13:41  Ref. Range 01/22/2021 13:09  Scan Result Unknown 46 ml   In and Out Catheterization  I instructed him on CIC technique.  Patient was cleaned and prepped in a sterile fashion with betadine . A 16 FR coude speedi cath was inserted no complications were noted , 75ml of urine return was noted, urine was orange clear in color secondary to Pyridium.  Bladder was drained  And catheter was removed with out difficulty.    Performed by: Myself and Mr. Beauchamp  Assessment & Plan:    1. Difficulty urinating  -explained that this may be due to the radiation and may improve with time.  He should continue the Flomax and self cath if necessary for inability to urinate.  We will schedule a cysto in 2 to 3 weeks to evaluate for stricture disease if he is still having issues.  He will stop the Pyridium at this time as he is not having dysuria.    2. Prostate cancer -followed by Dr. Janese Banks  3. Bladder cancer -yearly UA's    Return for cysto for evaluation for stricture disease .  These notes generated with voice recognition software. I apologize for typographical errors.  Zara Council, PA-C  [facility] [address]  [place], [place] [address] [phone]  I spent 30 minutes on the day of the encounter to include pre-visit record review, face-to-face time with the patient, and post-visit ordering of tests.

## 2021-01-22 NOTE — Addendum Note (Signed)
Addended by: Verlene Mayer A on: 01/22/2021 02:07 PM   Modules accepted: Orders

## 2021-02-12 ENCOUNTER — Other Ambulatory Visit: Payer: Self-pay | Admitting: Urology

## 2021-02-13 ENCOUNTER — Other Ambulatory Visit: Payer: Self-pay | Admitting: Urology

## 2021-04-12 ENCOUNTER — Encounter: Payer: Self-pay | Admitting: Urology

## 2021-04-12 NOTE — Progress Notes (Signed)
Dictated letter

## 2021-04-14 ENCOUNTER — Telehealth: Payer: Self-pay | Admitting: Urology

## 2021-04-14 NOTE — Telephone Encounter (Signed)
Pt stopped by office to pick up letter from [facility].  I printed letter and gave to pt.

## 2022-02-01 NOTE — Progress Notes (Incomplete)
02/01/22 ?8:02 PM  ? ?Chase Wallace ?24-Oct-1940 ?[id number] ? ?Referring provider:  ?[facility] [address] ?[place],  [place] [address] ?No chief complaint on file. ? ? ?Urological history  ? ? ? ?HPI: ?Chase Wallace is a 81 y.o.male ? ? ? ? ? ?PMH: ?Past Medical History:  ?Diagnosis Date  ? Abnormal findings on cytological and histological examination of urine 06/04/2015  ? Abnormal urine cytology 06/04/2015  ? Bladder cancer ([place])   ? Cervical radiculitis 12/30/2014  ? Colon polyps   ? DDD (degenerative disc disease), cervical 12/30/2014  ? Diabetes mellitus without complication ([place])   ? ED (erectile dysfunction) of organic origin 12/28/2012  ? Hemorrhoids   ? History of bladder cancer 12/28/2012  ? Hyperlipidemia   ? Hypertension   ? Male erectile dysfunction 12/28/2012  ? Microscopic hematuria 06/03/2015  ? Other cervical disc degeneration, unspecified cervical region 12/30/2014  ? Personal history of prostate cancer 12/28/2012  ? ? ?Surgical History: ?Past Surgical History:  ?Procedure Laterality Date  ? APPENDECTOMY    ? CHOLECYSTECTOMY    ? COLONOSCOPY WITH PROPOFOL N/A 11/14/2018  ? Procedure: COLONOSCOPY WITH PROPOFOL;  Surgeon: Lollie Sails, MD;  Location[facility] END[facility]COPY;  Service: Endoscopy;  Laterality: N/A;  ? Laproscopic Prostatectomy    ? Removal of Nasal Polyps    ? ? ?Home Medications:  ?Allergies as of 02/05/2022   ? ?   Reactions  ? Nsaids Other (See Comments)  ? Elevated creatinine  ? ?  ? ?  ?Medication List  ?  ? ?  ? Accurate as of February 01, 2022  8:02 PM. If you have any questions, ask your nurse or doctor.  ?  ?  ? ?  ? ?amLODipine 10 MG tablet ?Commonly known as: NORVASC ?Take 10 mg by mouth. ?  ?atorvastatin 20 MG tablet ?Commonly known as: LIPITOR ?Take 20 mg by mouth. ?  ?azelastine 0.1 % nasal spray ?Commonly known as: ASTELIN ?  ?fluticasone 50 MCG/ACT nasal spray ?Commonly known as: FLONASE ?  ?glipiZIDE 5 MG tablet ?Commonly known as: GLUCOTROL ?Take 5 mg by mouth. ?   ?HYDROcodone-acetaminophen 5-325 MG tablet ?Commonly known as: NORCO/VICODIN ?1/2-1 po bid ?  ?lisinopril 20 MG tablet ?Commonly known as: ZESTRIL ?Take 20 mg by mouth. ?  ?melatonin 1 MG Tabs tablet ?Take by mouth. ?  ?metFORMIN 500 MG tablet ?Commonly known as: GLUCOPHAGE ?Take 500 mg by mouth. ?  ?omeprazole 20 MG capsule ?Commonly known as: PRILOSEC ?Take 20 mg by mouth. ?  ?tamsulosin 0.4 MG Caps capsule ?Commonly known as: FLOMAX ?Take 0.4 mg by mouth daily. ?  ? ?  ? ? ?Allergies:  ?Allergies  ?Allergen Reactions  ? Nsaids Other (See Comments)  ?  Elevated creatinine  ? ? ?Family History: ?No family history on file. ? ?Social History:  reports that he has quit smoking. He has never used smokeless tobacco. He reports that he does not drink alcohol and does not use drugs. ? ? ?Physical Exam: ?There were no vitals taken for this visit.  ?Constitutional:  Alert and oriented, No acute distress. ?HEENT: [place] AT, moist mucus membranes.  Trachea midline, no masses. ?Cardiovascular: No clubbing, cyanosis, or edema. ?Respiratory: Normal respiratory effort, no increased work of breathing. ?GI: Abdomen is soft, nontender, nondistended, no abdominal masses ?GU: No CVA tenderness ?Lymph: No cervical or inguinal lymphadenopathy. ?Skin: No rashes, bruises or suspicious lesions. ?Neurologic: Grossly intact, no focal deficits, moving all 4 extremities. ?Psychiatric: Normal mood  and affect. ? ?Laboratory Data: ? ?Lab Results  ?Component Value Date  ? CREATININE 1.08 04/14/2020  ? ? ? ?No results found for: HGBA1C ? ?Urinalysis ? ? ?Pertinent Imaging: ? ? ?Assessment & Plan:   ? ? ?No follow-ups on file. ? ?[facility] ?[address] ?[place], [place] [address] ?[phone] ? ?I,Kailey Littlejohn,acting as a Education administrator for [employer], PA-C.,have documented all relevant documentation on the behalf of [place], PA-C,as directed by  [facility], PA-C while in the presence of [place], PA-C. ? ?

## 2022-02-05 ENCOUNTER — Ambulatory Visit: Payer: Medicare PPO | Admitting: Urology

## 2022-04-27 ENCOUNTER — Ambulatory Visit: Payer: Medicare PPO | Admitting: Physician Assistant

## 2022-04-27 VITALS — BP 175/69 | HR 105 | Ht 66.0 in | Wt 160.0 lb

## 2022-04-27 DIAGNOSIS — N3945 Continuous leakage: Secondary | ICD-10-CM | POA: Diagnosis not present

## 2022-04-27 DIAGNOSIS — R39198 Other difficulties with micturition: Secondary | ICD-10-CM | POA: Diagnosis not present

## 2022-04-27 LAB — URINALYSIS, COMPLETE
Bilirubin, UA: NEGATIVE
Ketones, UA: NEGATIVE
Leukocytes,UA: NEGATIVE
Nitrite, UA: NEGATIVE
Specific Gravity, UA: 1.01 (ref 1.005–1.030)
Urobilinogen, Ur: 0.2 mg/dL (ref 0.2–1.0)
pH, UA: 5 (ref 5.0–7.5)

## 2022-04-27 LAB — MICROSCOPIC EXAMINATION
Bacteria, UA: NONE SEEN
RBC, Urine: NONE SEEN /hpf (ref 0–2)

## 2022-04-27 LAB — BLADDER SCAN AMB NON-IMAGING

## 2022-04-27 MED ORDER — MIRABEGRON ER 50 MG PO TB24: 50.0000 mg | ORAL_TABLET | Freq: Every day | ORAL | 0 refills | Status: DC

## 2022-04-27 NOTE — Patient Instructions (Signed)
    [email or web address]  The [employer] Incontinence Clamp is an external medical device used to control urine leakage by compressing the urethra and preventing the flow of urine   Lets you maintain your active lifestyle Cost effective, saving you lots of money on adult incontinence briefs Can be worn during any activity Ergonomic design promotes confidence and provides all-day comfort    Step 3 Latch the incontinence clamp to compress the  urethra at the level that's comfortable to you         Step 2 Place your penis between the silicone rubber pads with the  incontinence clamp about halfway down the shaft of your penis Step 1 Open the incontinence clamp by  releasing the catch and lifting up the top part  

## 2022-04-27 NOTE — Progress Notes (Signed)
04/27/2022 4:54 PM   Chase Wallace 1941/03/07 [id number]  CC: Chief Complaint  Patient presents with   Urinary Incontinence   HPI: Chase Wallace is a 81 y.o. male with PMH prostate cancer s/p radical prostatectomy in 2000, low-grade TA urothelial carcinoma who is discontinued annual surveillance, and ED s/p IPP who presents today for evaluation of urinary leakage.   Today he reports constant insensate urinary leakage without urgency or frequency.  He describes the leaking as a dribbling.  He was having some skin irritation at the head of his penis due to the moisture from his pads, however he started changing these more frequently and the irritation has improved.  He is on Flomax and reports that he feels he empties better with this.  In-office UA today positive for 2+ glucose, trace intact blood, and 1+ protein; urine microscopy pan negative. PVR 38m.  PMH: Past Medical History:  Diagnosis Date   Abnormal findings on cytological and histological examination of urine 06/04/2015   Abnormal urine cytology 06/04/2015   Bladder cancer ([facility])    Cervical radiculitis 12/30/2014   Colon polyps    DDD (degenerative disc disease), cervical 12/30/2014   Diabetes mellitus without complication ([facility]    ED (erectile dysfunction) of organic origin 12/28/2012   Hemorrhoids    History of bladder cancer 12/28/2012   Hyperlipidemia    Hypertension    Male erectile dysfunction 12/28/2012   Microscopic hematuria 06/03/2015   Other cervical disc degeneration, unspecified cervical region 12/30/2014   Personal history of prostate cancer 12/28/2012    Surgical History: Past Surgical History:  Procedure Laterality Date   APPENDECTOMY     CHOLECYSTECTOMY     COLONOSCOPY WITH PROPOFOL N/A 11/14/2018   Procedure: COLONOSCOPY WITH PROPOFOL;  Surgeon: SLollie Sails MD;  [facility];  Service: Endoscopy;  Laterality: N/A;   Laproscopic Prostatectomy     Removal of Nasal Polyps       Home Medications:  Allergies as of 04/27/2022       Reactions   Flunisolide    Nsaids Other (See Comments)   Elevated creatinine        Medication List        Accurate as of April 27, 2022  4:54 PM. If you have any questions, ask your nurse or doctor.          amLODipine 10 MG tablet Commonly known as: NORVASC Take 10 mg by mouth.   atorvastatin 20 MG tablet Commonly known as: LIPITOR Take 20 mg by mouth.   azelastine 0.1 % nasal spray Commonly known as: ASTELIN   fluticasone 50 MCG/ACT nasal spray Commonly known as: FLONASE   glipiZIDE 5 MG tablet Commonly known as: GLUCOTROL Take 5 mg by mouth.   HYDROcodone-acetaminophen 5-325 MG tablet Commonly known as: NORCO/VICODIN 1/2-1 po bid   lisinopril 20 MG tablet Commonly known as: ZESTRIL Take 20 mg by mouth.   melatonin 1 MG Tabs tablet Take by mouth.   metFORMIN 500 MG tablet Commonly known as: GLUCOPHAGE Take 500 mg by mouth.   mirabegron ER 50 MG Tb24 tablet Commonly known as: MYRBETRIQ Take 1 tablet (50 mg total) by mouth daily.   omeprazole 20 MG capsule Commonly known as: PRILOSEC Take 20 mg by mouth.   tamsulosin 0.4 MG Caps capsule Commonly known as: FLOMAX Take 0.4 mg by mouth daily.        Allergies:  Allergies  Allergen Reactions   Flunisolide    Nsaids Other (  See Comments)    Elevated creatinine    Family History: No family history on file.  Social History:   reports that he has quit smoking. He has never used smokeless tobacco. He reports that he does not drink alcohol and does not use drugs.  Physical Exam: BP (!) 175/69   Pulse (!) 105   Ht '5\' 6"'$  (1.676 m)   Wt 160 lb (72.6 kg)   BMI 25.82 kg/m   Constitutional:  Alert and oriented, no acute distress, nontoxic appearing HEENT: Chase Wallace, AT Cardiovascular: No clubbing, cyanosis, or edema Respiratory: Normal respiratory effort, no increased work of breathing Skin: No rashes, bruises or suspicious  lesions Neurologic: Grossly intact, no focal deficits, moving all 4 extremities Psychiatric: Normal mood and affect  Laboratory Data: Results for orders placed or performed in visit on 04/27/22  Microscopic Examination   Urine  Result Value Ref Range   WBC, UA 0-5 0 - 5 /hpf   RBC, Urine None seen 0 - 2 /hpf   Epithelial Cells (non renal) 0-10 0 - 10 /hpf   Mucus, UA Present (A) Not Estab.   Bacteria, UA None seen None seen/Few  Urinalysis, Complete  Result Value Ref Range   Specific Gravity, UA 1.010 1.005 - 1.030   pH, UA 5.0 5.0 - 7.5   Color, UA Yellow Yellow   Appearance Ur Clear Clear   Leukocytes,UA Negative Negative   Protein,UA 1+ (A) Negative/Trace   Glucose, UA 2+ (A) Negative   Ketones, UA Negative Negative   RBC, UA Trace (A) Negative   Bilirubin, UA Negative Negative   Urobilinogen, Ur 0.2 0.2 - 1.0 mg/dL   Nitrite, UA Negative Negative   Microscopic Examination See below:   Bladder Scan (Post Void Residual) in office  Result Value Ref Range   Scan Result 44m    Assessment & Plan:   1. Continuous leakage of urine We had a frank conversation today that with constant dribbling after remote prostatectomy, I do not think pharmacotherapy will make a significant difference, however we can still try this.  Myrbetriq samples given today.  We will plan for symptom recheck and PVR in 4 weeks.  If he fails pharmacotherapy, we discussed that his treatment options include condom catheters versus penile incontinence clamps, though the latter may be difficult to wear with his IPP in place. - Urinalysis, Complete - Bladder Scan (Post Void Residual) in office - mirabegron ER (MYRBETRIQ) 50 MG TB24 tablet; Take 1 tablet (50 mg total) by mouth daily.  Dispense: 28 tablet; Refill: 0  Return in about 4 weeks (around 05/25/2022) for Symptom recheck with PVR.  SDebroah Loop PA-C  [facility] [address] [place]  [phone]

## 2022-05-21 ENCOUNTER — Telehealth: Payer: Self-pay | Admitting: Physician Assistant

## 2022-05-21 NOTE — Telephone Encounter (Signed)
Per Verbal from Chase Wallace pt will discuss at [place] when he reschedule

## 2022-05-21 NOTE — Telephone Encounter (Signed)
Pt called to cancel appt, he will c/b to r/s after vacation.  He also wanted Sam to know Myrbetriq is NOT working and he wants to try something else.

## 2022-05-23 IMAGING — CT CT ABD-PELV W/ CM
2 of 5 series · 16 of 46 positions shown, 18 images · IV contrast (omnipaque)
Comparison: 01/19/16.

CLINICAL DATA: Prostatectomy for chemotherapy. Ex-smoker, quitting
25 years ago. Elevated PSA.

EXAM:
CT ABDOMEN AND PELVIS WITH CONTRAST
TECHNIQUE: Multidetector CT imaging of the abdomen and pelvis was performed
using the standard protocol following bolus administration of
intravenous contrast.
CONTRAST:  100mL OMNIPAQUE IOHEXOL 300 MG/ML SOLN 100 cc Omnipaque
350

[Series 2: abd pelvis 5.00 · axial · 0.75mm/px · z∈[-1534,-1119]mm · 13 of 93 slices shown, 15 images]
[im 5/93  soft-tissue]
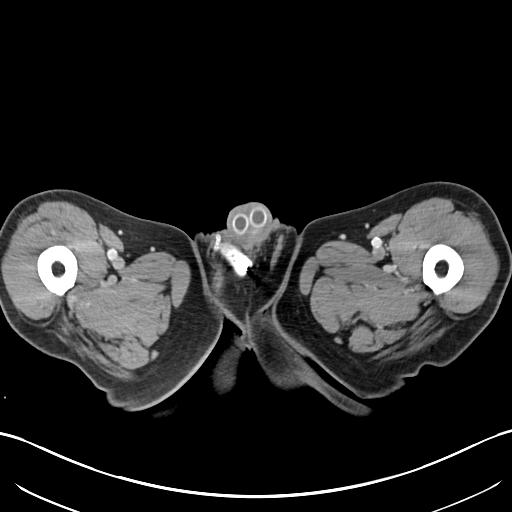
[im 5/93  bone]
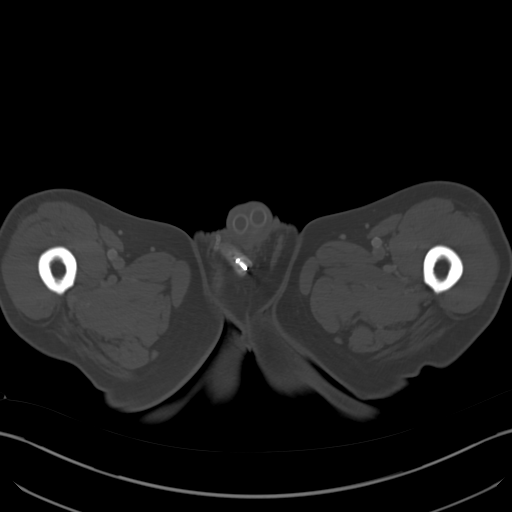
[im 14/93  soft-tissue]
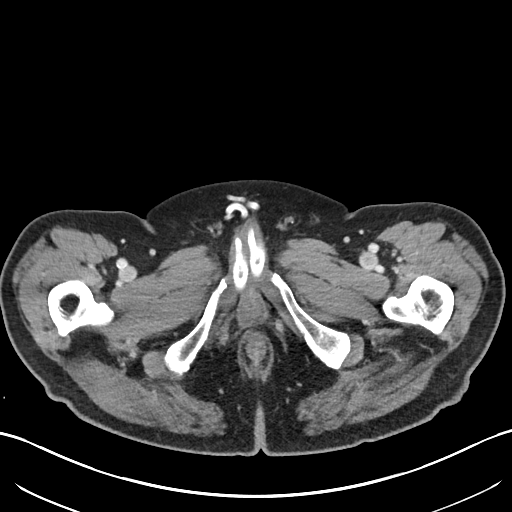
[im 19/93  soft-tissue]
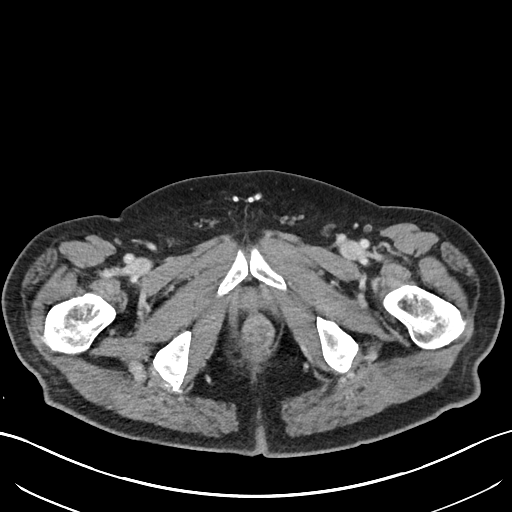
[im 28/93  soft-tissue]
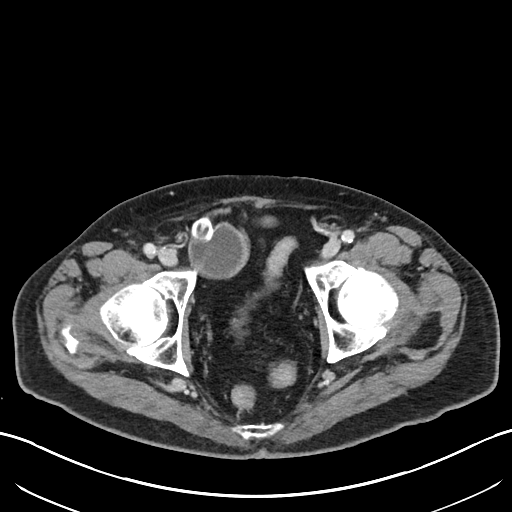
[im 33/93  soft-tissue]
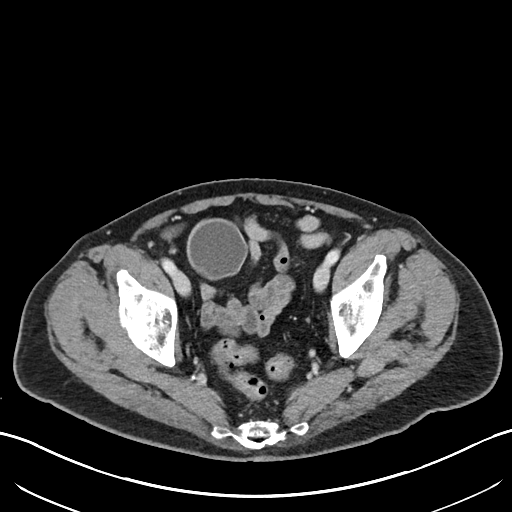
[im 42/93  soft-tissue]
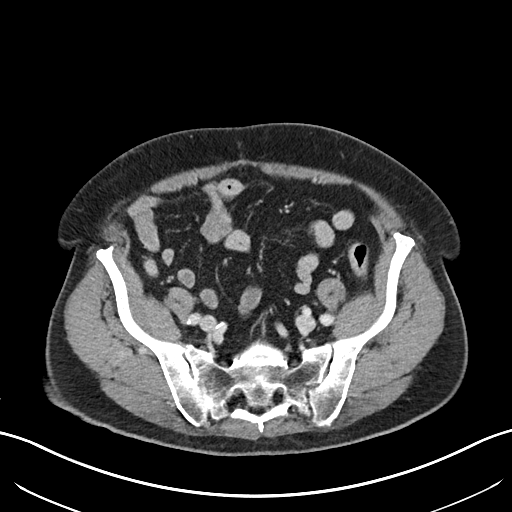
[im 47/93  soft-tissue]
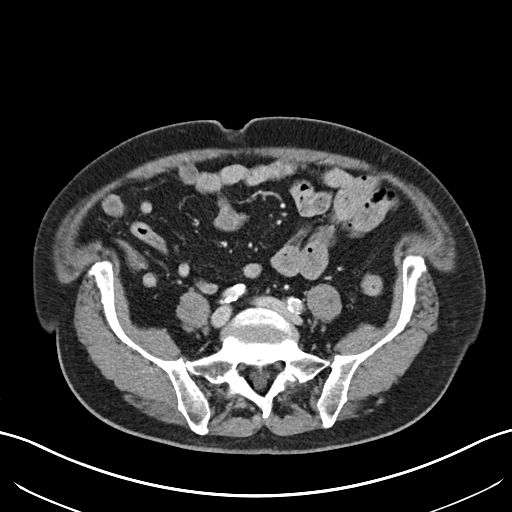
[im 51/93  soft-tissue]
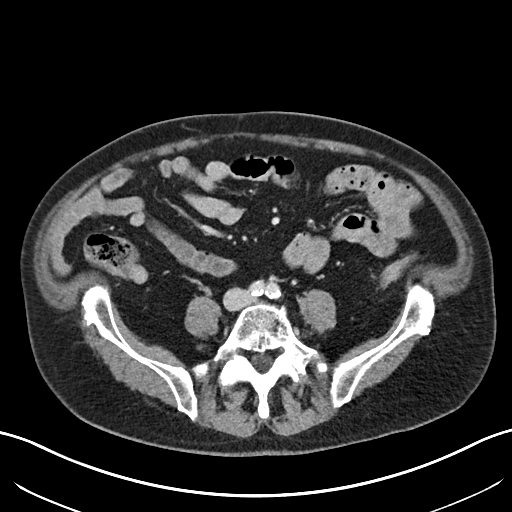
[im 60/93  soft-tissue]
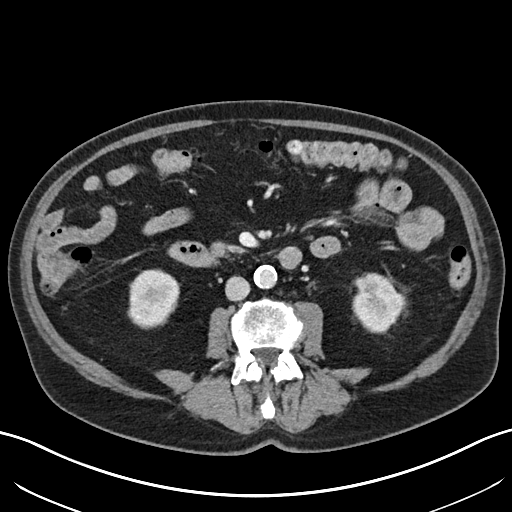
[im 60/93  bone]
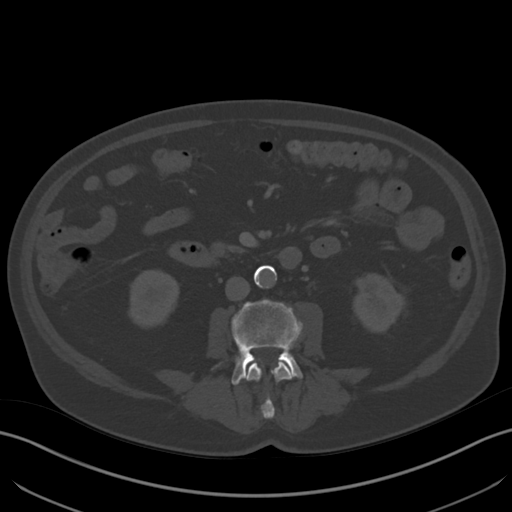
[im 65/93  soft-tissue]
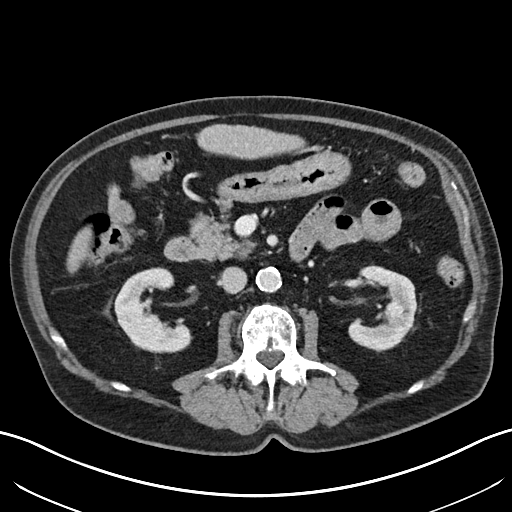
[im 74/93  soft-tissue]
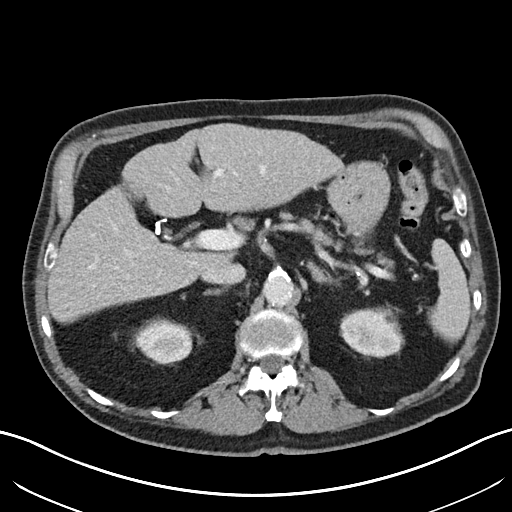
[im 79/93  soft-tissue]
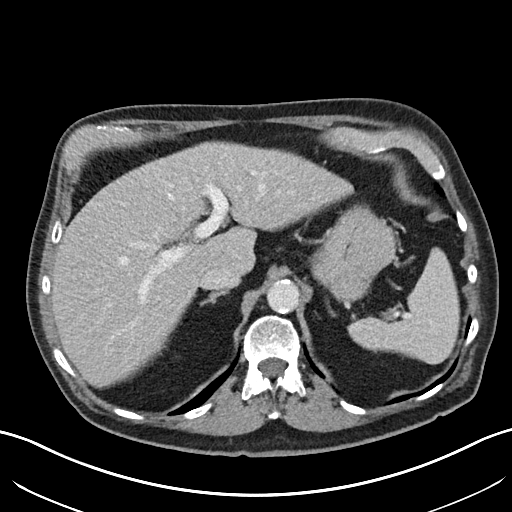
[im 88/93  soft-tissue]
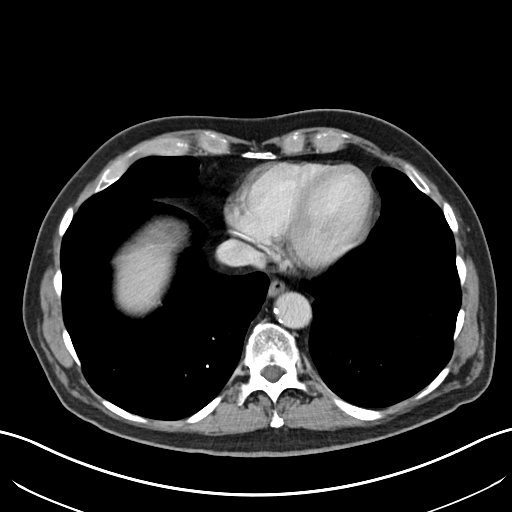

[Series 4: coronals abd pelvis 2.00 cor · coronal · 0.75mm/px · 3 of 136 slices shown]
[im 46/136  soft-tissue]
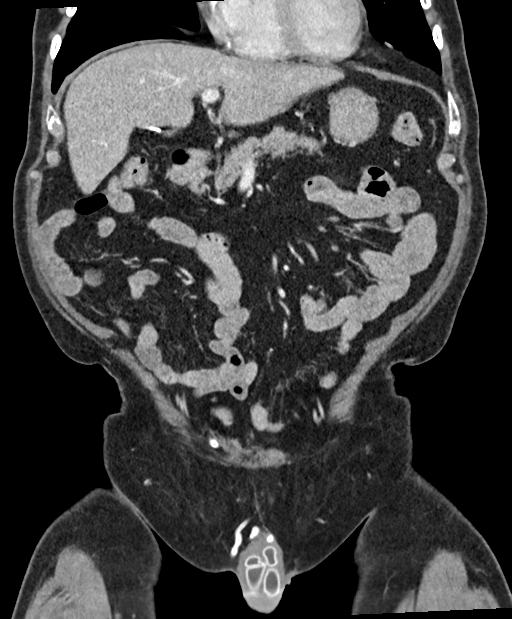
[im 61/136  soft-tissue]
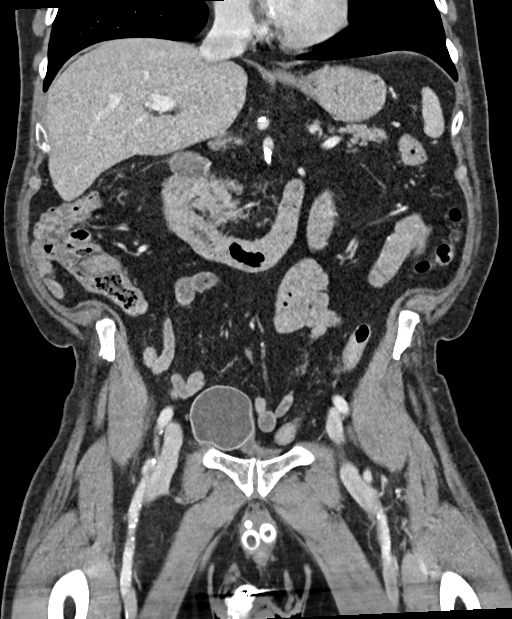
[im 76/136  soft-tissue]
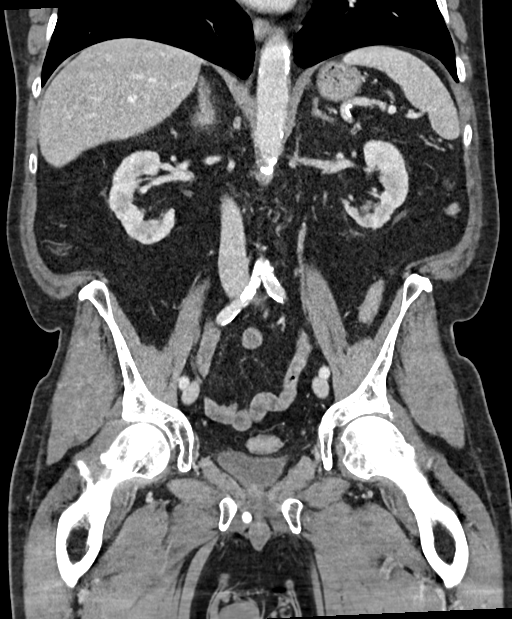

[16 of 46 positions shown; findings below may reference images not displayed]

FINDINGS: Lower chest: Clear lung bases. Normal heart size without pericardial
or pleural effusion. Right coronary artery atherosclerosis.

Hepatobiliary: Irregular hepatic capsule. No focal liver lesion.
Cholecystectomy, without biliary ductal dilatation.

Pancreas: Normal, without mass or ductal dilatation.

Spleen: Normal in size, without focal abnormality.

Adrenals/Urinary Tract: Normal adrenal glands. Bilateral too small
to characterize renal lesions. No hydronephrosis. Normal urinary
bladder.

Stomach/Bowel: Proximal gastric underdistention. Descending duodenal
diverticulum. Otherwise normal small bowel. Normal terminal ileum.

Vascular/Lymphatic: Advanced aortic and branch vessel
atherosclerosis. No abdominopelvic adenopathy.

Reproductive: Prostatectomy. No locally recurrent disease. Penile
pump. Right pelvic reservoir.

Other: Ventral fat containing abdominal wall laxity.

Musculoskeletal: Degenerate disc disease at L2-3. No worrisome
osseous lesions
IMPRESSION: 1. No acute process or evidence of metastatic disease in the abdomen
or pelvis.
2. Irregular hepatic capsule, suspicious for cirrhosis.
3. Prostatectomy, without recurrent disease.
4. Aortic Atherosclerosis ([id number].X). Coronary artery
atherosclerosis.

## 2022-05-25 ENCOUNTER — Ambulatory Visit: Payer: Medicare PPO | Admitting: Physician Assistant

## 2022-09-05 ENCOUNTER — Ambulatory Visit: Payer: Self-pay

## 2023-05-03 ENCOUNTER — Ambulatory Visit: Payer: Medicare PPO | Admitting: Physician Assistant

## 2023-05-03 ENCOUNTER — Encounter: Payer: Self-pay | Admitting: Physician Assistant

## 2023-05-03 VITALS — BP 159/79 | HR 93 | Wt 150.0 lb

## 2023-05-03 DIAGNOSIS — N3945 Continuous leakage: Secondary | ICD-10-CM

## 2023-05-03 DIAGNOSIS — R300 Dysuria: Secondary | ICD-10-CM | POA: Diagnosis not present

## 2023-05-03 LAB — URINALYSIS, COMPLETE
Bilirubin, UA: NEGATIVE
Ketones, UA: NEGATIVE
Leukocytes,UA: NEGATIVE
Nitrite, UA: NEGATIVE
RBC, UA: NEGATIVE
Specific Gravity, UA: 1.015 (ref 1.005–1.030)
Urobilinogen, Ur: 0.2 mg/dL (ref 0.2–1.0)
pH, UA: 5 (ref 5.0–7.5)

## 2023-05-03 LAB — MICROSCOPIC EXAMINATION

## 2023-05-03 LAB — BLADDER SCAN AMB NON-IMAGING

## 2023-05-03 MED ORDER — KETOCONAZOLE 2 % EX CREA
TOPICAL_CREAM | CUTANEOUS | 1 refills | Status: AC
Start: 2023-05-03 — End: ?

## 2023-05-03 MED ORDER — PHENAZOPYRIDINE HCL 200 MG PO TABS
200.0000 mg | ORAL_TABLET | Freq: Three times a day (TID) | ORAL | 0 refills | Status: AC | PRN
Start: 2023-05-03 — End: ?

## 2023-05-03 NOTE — Progress Notes (Signed)
05/03/2023 4:24 PM   Carolynn Sayers Mar 03, 1941 [id number]  CC: Chief Complaint  Patient presents with   burning urination   HPI: Chase Wallace is a 82 y.o. male with PMH prostate cancer s/p radical prostatectomy in 2000, low-grade TA urothelial carcinoma who has discontinued annual surveillance, and ED s/p IPP who presents today for evaluation of possible UTI.   Today he reports 1 day of burning/stinging at the tip of his penis with urination.  He is unsure if this represents UTI versus moisture irritation due to chronic leakage.  He is on Jardiance x 1 month and stopped it yesterday because he thought this might be contributing to his symptoms, however he denies other genitourinary changes in the past month.  Pyridium has helped his burning in the past.  He admits to continuous urinary leakage.  He wears pads, but states he could stand to change these more often.  He has on incontinence clamp at home but does not use it consistently.  In-office UA today positive for 3+ glucose and 1+ protein; urine microscopy with moderate bacteria.  PVR 42 mL.  PMH: Past Medical History:  Diagnosis Date   Abnormal findings on cytological and histological examination of urine 06/04/2015   Abnormal urine cytology 06/04/2015   Bladder cancer (HCC)    Cervical radiculitis 12/30/2014   Colon polyps    DDD (degenerative disc disease), cervical 12/30/2014   Diabetes mellitus without complication [facility])    ED (erectile dysfunction) of organic origin 12/28/2012   Hemorrhoids    History of bladder cancer 12/28/2012   Hyperlipidemia    Hypertension    Male erectile dysfunction 12/28/2012   Microscopic hematuria 06/03/2015   Other cervical disc degeneration, unspecified cervical region 12/30/2014   Personal history of prostate cancer 12/28/2012    Surgical History: Past Surgical History:  Procedure Laterality Date   APPENDECTOMY     CHOLECYSTECTOMY     COLONOSCOPY WITH PROPOFOL N/A 11/14/2018    Procedure: COLONOSCOPY WITH PROPOFOL;  Surgeon: Christena Deem, MD;  Location[facility] ENDOSCOPY;  Service: Endoscopy;  Laterality: N/A;   Laproscopic Prostatectomy     Removal of Nasal Polyps      Home Medications:  Allergies as of 05/03/2023       Reactions   Flunisolide    Nsaids Other (See Comments)   Elevated creatinine        Medication List        Accurate as of May 03, 2023  4:24 PM. If you have any questions, ask your nurse or doctor.          STOP taking these medications    mirabegron ER 50 MG Tb24 tablet Commonly known as: MYRBETRIQ Stopped by: Carman Ching   omeprazole 20 MG capsule Commonly known as: PRILOSEC Stopped by: Carman Ching       TAKE these medications    amLODipine 10 MG tablet Commonly known as: NORVASC Take 10 mg by mouth.   atorvastatin 20 MG tablet Commonly known as: LIPITOR Take 20 mg by mouth.   azelastine 0.1 % nasal spray Commonly known as: ASTELIN   fluticasone 50 MCG/ACT nasal spray Commonly known as: FLONASE   glipiZIDE 5 MG tablet Commonly known as: GLUCOTROL Take 5 mg by mouth.   HYDROcodone-acetaminophen 5-325 MG tablet Commonly known as: NORCO/VICODIN 1/2-1 po bid   ketoconazole 2 % cream Commonly known as: NIZORAL Apply topically to rash 1-2 times daily for 2-4 weeks. Started by: Carman Ching   lisinopril  20 MG tablet Commonly known as: ZESTRIL Take 20 mg by mouth.   melatonin 1 MG Tabs tablet Take by mouth.   metFORMIN 500 MG tablet Commonly known as: GLUCOPHAGE Take 500 mg by mouth.   phenazopyridine 200 MG tablet Commonly known as: Pyridium Take 1 tablet (200 mg total) by mouth 3 (three) times daily as needed for pain. Started by: Carman Ching   tamsulosin 0.4 MG Caps capsule Commonly known as: FLOMAX Take 0.4 mg by mouth daily.        Allergies:  Allergies  Allergen Reactions   Flunisolide    Nsaids Other (See Comments)    Elevated  creatinine    Family History: No family history on file.  Social History:   reports that he has quit smoking. He has never used smokeless tobacco. He reports that he does not drink alcohol and does not use drugs.  Physical Exam: BP (!) 159/79   Pulse 93   Wt 150 lb (68 kg)   BMI 24.21 kg/m   Constitutional:  Alert and oriented, no acute distress, nontoxic appearing HEENT: [place], AT Cardiovascular: No clubbing, cyanosis, or edema Respiratory: Normal respiratory effort, no increased work of breathing GU: Stress incontinence.  Mild erythema around the urethral meatus. Skin: No rashes, bruises or suspicious lesions Neurologic: Grossly intact, no focal deficits, moving all 4 extremities Psychiatric: Normal mood and affect  Laboratory Data: Results for orders placed or performed in visit on 05/03/23  Microscopic Examination   Urine  Result Value Ref Range   WBC, UA 0-5 0 - 5 /hpf   RBC, Urine 0-2 0 - 2 /hpf   Epithelial Cells (non renal) 0-10 0 - 10 /hpf   Casts Present (A) None seen /lpf   Cast Type Granular casts (A) N/A   Mucus, UA Present (A) Not Estab.   Bacteria, UA Moderate (A) None seen/Few  Urinalysis, Complete  Result Value Ref Range   Specific Gravity, UA 1.015 1.005 - 1.030   pH, UA 5.0 5.0 - 7.5   Color, UA Yellow Yellow   Appearance Ur Clear Clear   Leukocytes,UA Negative Negative   Protein,UA 1+ (A) Negative/Trace   Glucose, UA 3+ (A) Negative   Ketones, UA Negative Negative   RBC, UA Negative Negative   Bilirubin, UA Negative Negative   Urobilinogen, Ur 0.2 0.2 - 1.0 mg/dL   Nitrite, UA Negative Negative   Microscopic Examination See below:   Bladder Scan (Post Void Residual) in office  Result Value Ref Range   Scan Result 42ml    Assessment & Plan:   1. Dysuria UA is rather bland, though will send for culture to confirm.  I suspect moisture dermatitis at the tip of his penis is causing irritation with voiding.  Will prescribe Pyridium and topical  ketoconazole given Jardiance use and glucose urea, though overall I think moisture dermatitis is more likely than yeast.  We discussed increasing frequency of pad changes, increasing use of incontinence clamp, and using barrier creams to protect the head of the penis.  He declines to try condom catheters. - Urinalysis, Complete - Bladder Scan (Post Void Residual) in office - phenazopyridine (PYRIDIUM) 200 MG tablet; Take 1 tablet (200 mg total) by mouth 3 (three) times daily as needed for pain.  Dispense: 10 tablet; Refill: 0 - CULTURE, URINE COMPREHENSIVE - ketoconazole (NIZORAL) 2 % cream; Apply topically to rash 1-2 times daily for 2-4 weeks.  Dispense: 60 each; Refill: 1  Return in about 1 year (around  05/02/2024) for Annual follow-up with Dr. Lonna Cobb with UA, PVR.  Carman Ching, PA-C  [facility]  [address] [place], [place] [address] [phone]

## 2023-05-06 LAB — CULTURE, URINE COMPREHENSIVE

## 2023-06-02 ENCOUNTER — Ambulatory Visit: Payer: Self-pay | Admitting: General Surgery

## 2023-06-02 NOTE — H&P (Signed)
PATIENT PROFILE: Chase Wallace is a 82 y.o. male who presents to the Clinic for evaluation of right annual hernia.   PCP:  Charm Barges, MD   HISTORY OF PRESENT ILLNESS: Mr. Chase Wallace reports he has been feeling right groin since few weeks ago.  He endorses that it is started to get very uncomfortable.  The pain is localized to the right groin.  Pain does not radiate to other part of the body.  Pain aggravated by applying pressure and hurting activities.  Alleviating factor is resting.  So far patient has been able to reduce the hernia.  He does feel that it is getting bigger.  Denies any episode of abdominal distention, nausea or vomiting.     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  Recommended f/u colonoscopy in 3 years.        Abnormal findings on cytological and histological examination of urine 06/04/2015    Other microscopic hematuria 06/03/2015    DDD (degenerative disc disease), cervical 12/30/2014    Cervical radiculitis 12/30/2014    Other cervical disc degeneration, unspecified cervical region 12/30/2014    Male erectile dysfunction 12/28/2012    Personal history of malignant neoplasm of bladder 12/28/2012    Personal history of malignant neoplasm of prostate 12/28/2012      GENERAL REVIEW OF SYSTEMS:    General ROS: negative for - chills, fatigue, fever, weight gain or weight loss Allergy and Immunology ROS: negative for - hives  Hematological and Lymphatic ROS: negative for - bleeding problems or bruising, negative for palpable nodes Endocrine ROS: negative for - heat or cold intolerance, hair changes Respiratory ROS: negative for - cough, shortness of breath or wheezing Cardiovascular ROS: no  chest pain or palpitations GI ROS: negative for nausea, vomiting, abdominal pain, diarrhea, constipation Musculoskeletal ROS: negative for - joint swelling or muscle pain Neurological ROS: negative for - confusion, syncope Dermatological ROS: negative for pruritus and rash Psychiatric: negative for anxiety, depression, difficulty sleeping and memory loss   MEDICATIONS: Current Medications        Current Outpatient Medications  Medication Sig Dispense Refill   alogliptin (NESINA) 25 mg tablet Take 1 tablet by mouth once daily       amLODIPine (NORVASC) 10 MG tablet Take 1 tablet (10 mg total) by mouth       atorvastatin (LIPITOR) 20 MG tablet Take 1 tablet (20 mg total) by mouth       baclofen (LIORESAL) 5 mg tablet 1-2 po bid prn 60 tablet 0   buprenorphine (BUTRANS) 5 mcg/hour PTWK Apply topically.  Change every 7 days. 4 patch 5   empagliflozin (JARDIANCE) 25 mg tablet TAKE ONE TABLET BY MOUTH EVERY DAY FOR TYPE 2 DIABETES MELLITUS       FLUoxetine (PROZAC) 20 MG capsule Take 1 capsule (20 mg total) by mouth once daily       HYDROcodone-acetaminophen (NORCO) 5-325 mg tablet TAKE 1/2 TO 1 TABLET TWICE A DAY 60 tablet 0   lisinopril (PRINIVIL,ZESTRIL) 40 MG tablet Take 1 tablet (40 mg total) by mouth once daily       magnesium L-lactate (  MAGTAB) 84 mg TbER SR tablet Take 1 tablet by mouth 2 (two) times daily       melatonin 1 mg tablet Take by mouth       metFORMIN (GLUCOPHAGE) 500 MG tablet Take 1 tablet (500 mg total) by mouth daily with breakfast       omeprazole (PRILOSEC) 20 MG DR capsule Take 1 capsule (20 mg total) by mouth once daily       naloxone (NARCAN) 4 mg/actuation nasal spray Place 1 spray (4 mg total) into one nostril once as needed (if not breathing or overdose is suspected.) for up to 1 dose Give 2nd dose in 5-10 min if not responding or if sx return for up to 1 dose. 2 each 1    No current facility-administered medications for this visit.         ALLERGIES: Flunisolide, Nsaids (non-steroidal anti-inflammatory drug), and Tamsulosin   PAST MEDICAL HISTORY: Past Medical History      Past Medical History:  Diagnosis Date   Bladder cancer (CMS/HHS-HCC)      Diagnosed 1987.   Diabetes mellitus type 2, uncomplicated (CMS/HHS-HCC)     History of colon polyps     History of hemorrhoids     Hyperlipidemia     Hypertension     Prostate cancer (CMS/HHS-HCC)      S/p prostatectomy in 1999        PAST SURGICAL HISTORY: Past Surgical History       Past Surgical History:  Procedure Laterality Date   LAPAROSCOPIC PROSTATECTOMY   1999   Open release stenosed left thumb flexor sheath   03/12/2009   CHOLECYSTECTOMY   2013   COLONOSCOPY   11/14/2018    Tubular adenoma of the colon/Repeat 55yrs/MUS   APPENDECTOMY       Hemorrhoidal surgery       Removal of nasal polyps       Transurethral resection        with subsequent penile implant.        FAMILY HISTORY: Family History        Family History  Problem Relation Name Age of Onset   High blood pressure (Hypertension) Mother       Diabetes type Wallace Mother       Osteoporosis (Thinning of bones) Mother       Breast cancer Mother       Prostate cancer Father       Colon cancer Neg Hx       Colon polyps Neg Hx       Stomach cancer Neg Hx            SOCIAL HISTORY: Social History  Social History         Socioeconomic History   Marital status: Widowed  Tobacco Use   Smoking status: Former      Types: Cigarettes      Start date: 09/09/1994   Smokeless tobacco: Never  Vaping Use   Vaping status: Never Used  Substance and Sexual Activity   Alcohol use: No      Alcohol/week: 0.0 standard drinks of alcohol   Drug use: Never   Sexual activity: Defer        PHYSICAL EXAM:    Vitals:    06/02/23 1327  BP: (!) 147/84  Pulse: 100    Body mass index is 24.3 kg/m. Weight: 66.2 kg (146 lb)    GENERAL: Alert, active, oriented x3   HEENT: Pupils equal reactive to  light. Extraocular movements are intact. Sclera clear. Palpebral conjunctiva normal red color.Pharynx clear.   NECK: Supple with no palpable mass and no adenopathy.   LUNGS: Sound clear with no rales rhonchi or wheezes.   HEART: Regular rhythm S1 and S2 without murmur.   ABDOMEN: Soft and depressible, nontender with no palpable mass, no hepatomegaly.  Moderate size right inguinal hernia, reducible.   EXTREMITIES: Well-developed well-nourished symmetrical with no dependent edema.   NEUROLOGICAL: Awake alert oriented, facial expression symmetrical, moving all extremities.   REVIEW OF DATA: I have reviewed the following data today:      No visits with results within 3 Month(s) from this visit.  Latest known visit with results is:  Office Visit on 02/01/2022  Component Date Value   Summary - [facility] 02/04/2022 Note       ASSESSMENT: Mr. Rodela is a 82 y.o. male presenting for consultation for right inguinal hernia.     The patient presents with a symptomatic, reducible inguinal hernia. Patient was oriented about the diagnosis of inguinal hernia and its implication. The patient was oriented about the treatment alternatives (observation vs surgical repair). Due to patient symptoms, repair is recommended. Patient oriented about the surgical procedure, the use of mesh and its risk of complications such as: infection, bleeding, injury to vas deference, vasculature and testicle, injury to bowel or bladder, and chronic pain.    Due to previous prostatectomy I recommend to proceed with open right inguinal hernia repair.  The patient reported he understood.   Non-recurrent unilateral inguinal hernia without obstruction or gangrene [K40.90]   PLAN: Right inguinal hernia repair with mesh            Avoid taking aspirin or blood thinner 5 days before surgery Contact us if you have any concern.    Patient verbalized understanding, all questions were answered, and were agreeable with the plan  outlined above.      Carolan Shiver, MD   Electronically signed by Carolan Shiver, MD

## 2023-06-08 ENCOUNTER — Encounter
Admission: RE | Admit: 2023-06-08 | Discharge: 2023-06-08 | Disposition: A | Discharge status: Home / Self Care | Payer: Medicare PPO | Source: Ambulatory Visit | Attending: General Surgery | Admitting: General Surgery

## 2023-06-08 ENCOUNTER — Other Ambulatory Visit: Payer: Self-pay

## 2023-06-08 DIAGNOSIS — I10 Essential (primary) hypertension: Secondary | ICD-10-CM

## 2023-06-08 DIAGNOSIS — E119 Type 2 diabetes mellitus without complications: Secondary | ICD-10-CM

## 2023-06-08 DIAGNOSIS — Z01812 Encounter for preprocedural laboratory examination: Secondary | ICD-10-CM

## 2023-06-08 HISTORY — DX: Personal history of urinary calculi: Z87.442

## 2023-06-08 HISTORY — DX: Malignant neoplasm of prostate: C61

## 2023-06-08 HISTORY — DX: Unilateral inguinal hernia, without obstruction or gangrene, not specified as recurrent: K40.90

## 2023-06-08 NOTE — Patient Instructions (Addendum)
Your procedure is scheduled on: 06/13/23 - Monday Report to the Registration Desk on the 1st floor of the [facility]. To find out your arrival time, please call [phone] between 1PM - 3PM on: 08/23 24 - Friday If your arrival time is 6:00 am, do not arrive before that time as the [facility] entrance doors do not open until 6:00 am.  REMEMBER: Instructions that are not followed completely may result in serious medical risk, up to and including death; or upon the discretion of your surgeon and anesthesiologist your surgery may need to be rescheduled.  Do not eat food or drink any liquids after midnight the night before surgery.  No gum chewing or hard candies.   One week prior to surgery: Stop Anti-inflammatories (NSAIDS) such as Advil, Aleve, Ibuprofen, Motrin, Naproxen, Naprosyn and Aspirin based products such as Excedrin, Goody's Powder, BC Powder.  Stop ANY OVER THE COUNTER supplements until after surgery.  You may take Tylenol if needed for pain up until the day of surgery.   TAKE ONLY THESE MEDICATIONS THE MORNING OF SURGERY WITH A SIP OF WATER:  amLODipine (NORVASC)  omeprazole (PRILOSEC) - (take one the night before and one on the morning of surgery - helps to prevent nausea after surgery.) HYDROcodone-acetaminophen if needed   - HOLD metFORMIN (GLUCOPHAGE) starting 06/11/23, - HOLD lisinopril (PRINIVIL,ZESTRIL) on the morning  of surgery.  - HOLD glipiZIDE (GLUCOTROL) on the morning of surgery   No Alcohol for 24 hours before or after surgery.  No Smoking including e-cigarettes for 24 hours before surgery.  No chewable tobacco products for at least 6 hours before surgery.  No nicotine patches on the day of surgery.  Do not use any "recreational" drugs for at least a week (preferably 2 weeks) before your surgery.  Please be advised that the combination of cocaine and anesthesia may have negative outcomes, up to and including death. If you test positive for  cocaine, your surgery will be cancelled.  On the morning of surgery brush your teeth with toothpaste and water, you may rinse your mouth with mouthwash if you wish. Do not swallow any toothpaste or mouthwash.  Use CHG Soap or wipes as directed on instruction sheet.  Do not wear jewelry, make-up, hairpins, clips or nail polish.  Do not wear lotions, powders, or perfumes.   Do not shave body hair from the neck down 48 hours before surgery.  Contact lenses, hearing aids and dentures may not be worn into surgery.  Do not bring valuables to the hospital. [facility] is not responsible for any missing/lost belongings or valuables.   Notify your doctor if there is any change in your medical condition (cold, fever, infection).  Wear comfortable clothing (specific to your surgery type) to the hospital.  After surgery, you can help prevent lung complications by doing breathing exercises.  Take deep breaths and cough every 1-2 hours. Your doctor may order a device called an Incentive Spirometer to help you take deep breaths. When coughing or sneezing, hold a pillow firmly against your incision with both hands. This is called "splinting." Doing this helps protect your incision. It also decreases belly discomfort.  If you are being admitted to the hospital overnight, leave your suitcase in the car. After surgery it may be brought to your room.  In case of increased patient census, it may be necessary for you, the patient, to continue your postoperative care in the Same Day Surgery department.  If you are being discharged  the day of surgery, you will not be allowed to drive home. You will need a responsible individual to drive you home and stay with you for 24 hours after surgery.   If you are taking public transportation, you will need to have a responsible individual with you.  Please call the Pre-admissions Testing Dept. at [phone] if you have any questions about these  instructions.  Surgery Visitation Policy:  Patients having surgery or a procedure may have two visitors.  Children under the age of 55 must have an adult with them who is not the patient.  Inpatient Visitation:    Visiting hours are 7 a.m. to 8 p.m. Up to four visitors are allowed at one time in a patient room. The visitors may rotate out with other people during the day.  One visitor age 65 or older may stay with the patient overnight and must be in the room by 8 p.m.     Preparing for Surgery with CHLORHEXIDINE GLUCONATE (CHG) Soap  Chlorhexidine Gluconate (CHG) Soap  o An antiseptic cleaner that kills germs and bonds with the skin to continue killing germs even after washing  o Used for showering the night before surgery and morning of surgery  Before surgery, you can play an important role by reducing the number of germs on your skin.  CHG (Chlorhexidine gluconate) soap is an antiseptic cleanser which kills germs and bonds with the skin to continue killing germs even after washing.  Please do not use if you have an allergy to CHG or antibacterial soaps. If your skin becomes reddened/irritated stop using the CHG.  1. Shower the NIGHT BEFORE SURGERY and the MORNING OF SURGERY with CHG soap.  2. If you choose to wash your hair, wash your hair first as usual with your normal shampoo.  3. After shampooing, rinse your hair and body thoroughly to remove the shampoo.  4. Use CHG as you would any other liquid soap. You can apply CHG directly to the skin and wash gently with a scrungie or a clean washcloth.  5. Apply the CHG soap to your body only from the neck down. Do not use on open wounds or open sores. Avoid contact with your eyes, ears, mouth, and genitals (private parts). Wash face and genitals (private parts) with your normal soap.  6. Wash thoroughly, paying special attention to the area where your surgery will be performed.  7. Thoroughly rinse your body with warm  water.  8. Do not shower/wash with your normal soap after using and rinsing off the CHG soap.  9. Pat yourself dry with a clean towel.  10. Wear clean pajamas to bed the night before surgery.  12. Place clean sheets on your bed the night of your first shower and do not sleep with pets.  13. Shower again with the CHG soap on the day of surgery prior to arriving at the hospital.  14. Do not apply any deodorants/lotions/powders.  15. Please wear clean clothes to the hospital.

## 2023-06-09 ENCOUNTER — Telehealth: Payer: Self-pay | Admitting: Urgent Care

## 2023-06-09 ENCOUNTER — Encounter
Admission: RE | Admit: 2023-06-09 | Discharge: 2023-06-09 | Disposition: A | Discharge status: Home / Self Care | Payer: Medicare PPO | Source: Ambulatory Visit | Attending: General Surgery | Admitting: General Surgery

## 2023-06-09 ENCOUNTER — Encounter: Payer: Self-pay | Admitting: Urgent Care

## 2023-06-09 DIAGNOSIS — Z01818 Encounter for other preprocedural examination: Secondary | ICD-10-CM | POA: Diagnosis present

## 2023-06-09 DIAGNOSIS — E119 Type 2 diabetes mellitus without complications: Secondary | ICD-10-CM | POA: Insufficient documentation

## 2023-06-09 DIAGNOSIS — Z01812 Encounter for preprocedural laboratory examination: Secondary | ICD-10-CM

## 2023-06-09 DIAGNOSIS — D509 Iron deficiency anemia, unspecified: Secondary | ICD-10-CM

## 2023-06-09 DIAGNOSIS — Z8551 Personal history of malignant neoplasm of bladder: Secondary | ICD-10-CM | POA: Insufficient documentation

## 2023-06-09 DIAGNOSIS — I10 Essential (primary) hypertension: Secondary | ICD-10-CM | POA: Diagnosis not present

## 2023-06-09 DIAGNOSIS — R5383 Other fatigue: Secondary | ICD-10-CM

## 2023-06-09 DIAGNOSIS — Z8546 Personal history of malignant neoplasm of prostate: Secondary | ICD-10-CM | POA: Diagnosis not present

## 2023-06-09 HISTORY — DX: Diverticulosis of intestine, part unspecified, without perforation or abscess without bleeding: K57.90

## 2023-06-09 HISTORY — DX: Iron deficiency anemia, unspecified: D50.9

## 2023-06-09 LAB — HEMOGLOBIN A1C
Hgb A1c MFr Bld: 8.8 % — ABNORMAL HIGH (ref 4.8–5.6)
Mean Plasma Glucose: 205.86 mg/dL

## 2023-06-09 LAB — CBC
HCT: 28.6 % — ABNORMAL LOW (ref 39.0–52.0)
Hemoglobin: 8.3 g/dL — ABNORMAL LOW (ref 13.0–17.0)
MCH: 18.9 pg — ABNORMAL LOW (ref 26.0–34.0)
MCHC: 29 g/dL — ABNORMAL LOW (ref 30.0–36.0)
MCV: 65.1 fL — ABNORMAL LOW (ref 80.0–100.0)
Platelets: 392 10*3/uL (ref 150–400)
RBC: 4.39 MIL/uL (ref 4.22–5.81)
RDW: 20 % — ABNORMAL HIGH (ref 11.5–15.5)
WBC: 7.1 10*3/uL (ref 4.0–10.5)
nRBC: 0 % (ref 0.0–0.2)

## 2023-06-09 LAB — RETICULOCYTES
Immature Retic Fract: 25.3 % — ABNORMAL HIGH (ref 2.3–15.9)
RBC.: 4.44 MIL/uL (ref 4.22–5.81)
Retic Count, Absolute: 69.6 K/uL (ref 19.0–186.0)
Retic Ct Pct: 1.6 % (ref 0.4–3.1)

## 2023-06-09 LAB — BASIC METABOLIC PANEL
Anion gap: 11 (ref 5–15)
BUN: 18 mg/dL (ref 8–23)
CO2: 22 mmol/L (ref 22–32)
Calcium: 9.2 mg/dL (ref 8.9–10.3)
Chloride: 106 mmol/L (ref 98–111)
Creatinine, Ser: 1.1 mg/dL (ref 0.61–1.24)
GFR, Estimated: >60 mL/min (ref >60)
Glucose, Bld: 189 mg/dL — ABNORMAL HIGH (ref 70–99)
Potassium: 3.5 mmol/L (ref 3.5–5.1)
Sodium: 139 mmol/L (ref 135–145)

## 2023-06-09 LAB — FERRITIN: Ferritin: 6 ng/mL — ABNORMAL LOW (ref 24–336)

## 2023-06-09 LAB — TSH: TSH: 0.744 u[IU]/mL (ref 0.350–4.500)

## 2023-06-09 LAB — IRON AND TIBC
Iron: 16 ug/dL — ABNORMAL LOW (ref 45–182)
Saturation Ratios: 5 % — ABNORMAL LOW (ref 17.9–39.5)
TIBC: 351 ug/dL (ref 250–450)
UIBC: 335 ug/dL

## 2023-06-09 MED ORDER — IRON-VITAMIN C 65-125 MG PO TABS
1.0000 | ORAL_TABLET | Freq: Every day | ORAL | 3 refills | Status: AC
Start: 2023-06-09 — End: ?

## 2023-06-09 NOTE — Progress Notes (Addendum)
[facility] Perioperative Services: Pre-Admission/Anesthesia Testing  Abnormal Lab Notification and Treatment Plan of Care   Date: 06/09/23  Name: Chase Wallace MRN:   [id number]  Re: Abnormal labs noted during PAT appointment; Tx plan (oral Fe and referral to hematology)  Notified:    Provider Name Provider Role Notification Mode  Carolan Shiver, MD General Surgery Routed and/or faxed via Celedonio Miyamoto, MD Hematology Routed and/or faxed via [facility]   LAB VALUE(S):   Hospital Outpatient Visit on 06/09/2023  Component Date Value Ref Range Status   WBC 06/09/2023 7.1  4.0 - 10.5 K/uL Final   RBC 06/09/2023 4.39  4.22 - 5.81 MIL/uL Final   Hemoglobin 06/09/2023 8.3 (L)  13.0 - 17.0 g/dL Final   Comment: Reticulocyte Hemoglobin testing may be clinically indicated, consider ordering this additional test [id number]    HCT 06/09/2023 28.6 (L)  39.0 - 52.0 % Final   MCV 06/09/2023 65.1 (L)  80.0 - 100.0 fL Final   MCH 06/09/2023 18.9 (L)  26.0 - 34.0 pg Final   MCHC 06/09/2023 29.0 (L)  30.0 - 36.0 g/dL Final   RDW 37/07/6268 20.0 (H)  11.5 - 15.5 % Final   Platelets 06/09/2023 392  150 - 400 K/uL Final   nRBC 06/09/2023 0.0  0.0 - 0.2 % Final   Performed at [facility], [address] [place], [place] [address]   Sodium 06/09/2023 139  135 - 145 mmol/L Final   Potassium 06/09/2023 3.5  3.5 - 5.1 mmol/L Final   Chloride 06/09/2023 106  98 - 111 mmol/L Final   CO2 06/09/2023 22  22 - 32 mmol/L Final   Glucose, Bld 06/09/2023 189 (H)  70 - 99 mg/dL Final   Glucose reference range applies only to samples taken after fasting for at least 8 hours.   BUN 06/09/2023 18  8 - 23 mg/dL Final   Creatinine, Ser 06/09/2023 1.10  0.61 - 1.24 mg/dL Final   Calcium 27/12/5007 9.2  8.9 - 10.3 mg/dL Final   GFR, Estimated 06/09/2023 >60  >60 mL/min Final   Comment: (NOTE) Calculated using the CKD-EPI Creatinine Equation (2021)    Anion gap 06/09/2023  11  5 - 15 Final   Performed at [facility], [address] [place], [place] [address]   Ferritin 06/09/2023 6 (L)  24 - 336 ng/mL Final   Performed at [facility], [address] [place], [place] [address]   Iron 06/09/2023 16 (L)  45 - 182 ug/dL Final   TIBC 69/67/8938 351  250 - 450 ug/dL Final   Saturation Ratios 06/09/2023 5 (L)  17.9 - 39.5 % Final   UIBC 06/09/2023 335  ug/dL Final   Performed at [facility], [address] [place], [place] [address]   TSH 06/09/2023 0.744  0.350 - 4.500 uIU/mL Final   Comment: Performed by a 3rd Generation assay with a functional sensitivity of <=0.01 uIU/mL. Performed at [facility], [address] [place], [place] [address]    Retic Ct Pct 06/09/2023 1.6  0.4 - 3.1 % Final   RBC. 06/09/2023 4.44  4.22 - 5.81 MIL/uL Final   Retic Count, Absolute 06/09/2023 69.6  19.0 - 186.0 K/uL Final   Immature Retic Fract 06/09/2023 25.3 (H)  2.3 - 15.9 % Final   Performed at [facility], [address], [place], [place] [address]   Clinical Information and Notes:  Chase Wallace is scheduled  for an elective RIGHT INGUINAL HERNIA REPAIR on 06/13/2023.  Preoperative labs revealed a low hemoglobin of 8.3 g/dL. 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 Platelet count was normal. In review of all available labs, patient a normal CBC obtained on 04/14/2020; hemoglobin 13.5 g/dL, MCV 57.8 fL, MCH 46.9 pg.    Most recent colonoscopy performed on 11/14/2018 revealed several polyps within the transverse colon, diverticulosis, and nonbleeding internal hemorrhoids.  Past medical history significant for microscopic hematuria, nephrolithiasis, bladder cancer, and prostate cancer. Patient previously followed in the [facility] by Dr. Owens Shark, MD for biochemical recurrence following radical prostatectomy. Patient was last seen on 04/14/2020; notes reviewed. Plans were for a 3  month RTC with labs. Patient appears to have been lost to follow up. Of note, PSA was rechecked at the [place] on 04/13/2023, at which time it was normal at 0.02 ng/mL.   Additional labs added to preoperative studies to complete anemia workup. Unfortunately, we did not obtain the necessary tubes for vitamin deficiency testing or a urine sample to evaluate for persistent microscopic hematuria. Orders placed for add on TSH, reticulocyte count, ferritin, and iron studies.   Lab studies consistent with iron deficiency anemia. Contacted patient to discuss. Patient denies melena, hematochezia, hematemesis, significant epistaxis events, and bleeding gums. He reports that he consumes meat, but "not necessarily red meat", three times/week. Patient does not eat green leafy vegetables on a consistent basis. He is experiencing ice pica. Patient has been increasingly fatigued. Patient thought that it was "mental" (grief) following the loss of his wife in October 2023. Patient states, "I have been one step behind since my wife passed. I knew that I was not feeling well". Patient denies any chest pain and any significant shortness of breath.   Given the above labs values, will refer patient back over to Dr. Smith Robert (hematology/medical oncology) for follow up and ongoing management of his anemia. Unclear at this point, however patient may require intravenous iron replacement. In efforts to expedite management, and to improve chances that IV iron will be covered by insurance should it be required, will initiate oral iron therapy. Patient made aware that Rx for Iron + Vitamin C 65/125 mg being sent to his pharmacy today. Will start with patient taking it once daily. Dr. Smith Robert may elect to escalate therapy based on repeat labs. Discussed potential need for repeat endoscopic evaluation. Patient stated, "if it is a colonoscopy I am not going that route. I do not want to do that ever again. I have had too many of those in my life". With that  said, VCE may be a possibility for further evaluation.   Patient asking about surgery. Given his anemia, patient feels as if it is better to postpone pending optimization of his anemia. Discussed that Hgb level is appropriate for surgery, however the decision would be discussed with surgeon and anesthesia to ensure that everyone is on the same page. Patient advising that he has already made the decision and wishes to postpone surgery. I will make Dr. Hazle Quant (surgeon) aware. Patient advised to be expecting call from the cancer center regarding follow up appointment and plans for ongoing management.  Meds ordered this encounter  Medications   Iron-Vitamin C 65-125 MG TABS    Sig: Take 1 tablet by mouth daily.    Dispense:  30 tablet    Refill:  3   Encounter Diagnoses  Name Primary?   Pre-operative laboratory examination Yes   IDA (iron  deficiency anemia)    Fatigue    Quentin Mulling, MSN, APRN, FNP-C, CEN [facility]  Peri-operative Services Nurse Practitioner Phone: [phone] Fax: [phone] 06/09/23 4:04 PM  .

## 2023-06-10 ENCOUNTER — Other Ambulatory Visit: Payer: Self-pay | Admitting: Oncology

## 2023-06-10 DIAGNOSIS — D509 Iron deficiency anemia, unspecified: Secondary | ICD-10-CM | POA: Insufficient documentation

## 2023-06-10 DIAGNOSIS — D508 Other iron deficiency anemias: Secondary | ICD-10-CM

## 2023-06-13 ENCOUNTER — Ambulatory Visit: Admission: RE | Admit: 2023-06-13 | Payer: Medicare PPO | Source: Home / Self Care | Admitting: General Surgery

## 2023-06-13 ENCOUNTER — Encounter: Admission: RE | Payer: Self-pay | Source: Home / Self Care

## 2023-06-13 SURGERY — REPAIR, HERNIA, INGUINAL, ADULT
Anesthesia: General | Site: Inguinal | Laterality: Right

## 2023-06-17 ENCOUNTER — Inpatient Hospital Stay: Payer: Medicare PPO

## 2023-06-17 ENCOUNTER — Inpatient Hospital Stay: Payer: Medicare PPO | Attending: Oncology | Admitting: Oncology

## 2023-06-17 ENCOUNTER — Encounter: Payer: Self-pay | Admitting: Oncology

## 2023-06-17 VITALS — BP 130/69 | HR 70 | Temp 96.8°F | Resp 18 | Ht 66.0 in | Wt 157.0 lb

## 2023-06-17 VITALS — BP 149/60 | HR 60 | Temp 97.0°F | Resp 18

## 2023-06-17 DIAGNOSIS — D509 Iron deficiency anemia, unspecified: Secondary | ICD-10-CM | POA: Insufficient documentation

## 2023-06-17 DIAGNOSIS — Z8546 Personal history of malignant neoplasm of prostate: Secondary | ICD-10-CM | POA: Insufficient documentation

## 2023-06-17 DIAGNOSIS — D508 Other iron deficiency anemias: Secondary | ICD-10-CM

## 2023-06-17 DIAGNOSIS — Z9079 Acquired absence of other genital organ(s): Secondary | ICD-10-CM | POA: Diagnosis not present

## 2023-06-17 MED ORDER — SODIUM CHLORIDE 0.9 % IV SOLN
200.0000 mg | Freq: Once | INTRAVENOUS | Status: AC
  Administered 2023-06-17: 200 mg via INTRAVENOUS
  Filled 2023-06-17: qty 200

## 2023-06-17 MED ORDER — SODIUM CHLORIDE 0.9 % IV SOLN
Freq: Once | INTRAVENOUS | Status: AC
  Filled 2023-06-17: qty 250

## 2023-06-17 NOTE — Progress Notes (Unsigned)
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  He is status post radical prostatectomy.  Subsequently he had a rise in his PSA sometime in 2021 and underwent radiation at the [place].  He has now been referred for iron deficiency anemia.  Patient was supposed to go for hernia surgery with Dr. Maia Plan and as a part of his preoperative workup his H&H was found to be 8.3/28.6 with an MCV of 65.  Ferritin levels were low at 6 and iron saturation 5%.  Patient denies any blood loss in his stool or urine.  Denies any dark melanotic stools.  He has undergone colonoscopy with [facility] back in 2020 which did not show any evidence of bleeding.  Also states that he has gone through EGD in the past.  He has never had a capsule endoscopy.  ECOG PS- 1  Pain scale- 0   Review of systems- Review of Systems  Constitutional:  Positive for malaise/fatigue. Negative for chills, fever and weight loss.  HENT:  Negative for congestion, ear discharge and nosebleeds.   Eyes:  Negative for blurred vision.  Respiratory:  Negative for cough, hemoptysis, sputum production, shortness of breath and wheezing.   Cardiovascular:  Negative for chest pain, palpitations, orthopnea and claudication.  Gastrointestinal:  Negative for abdominal pain, blood in stool, constipation, diarrhea, heartburn, melena, nausea and vomiting.  Genitourinary:  Negative for dysuria, flank pain, frequency, hematuria and urgency.  Musculoskeletal:  Negative for back pain, joint pain and myalgias.  Skin:   Negative for rash.  Neurological:  Negative for dizziness, tingling, focal weakness, seizures, weakness and headaches.  Endo/Heme/Allergies:  Does not bruise/bleed easily.  Psychiatric/Behavioral:  Negative for depression and suicidal ideas. The patient does not have insomnia.     Allergies  Allergen Reactions   Flunisolide    Nsaids Other (See Comments)    Elevated creatinine    Patient Active Problem List   Diagnosis Date Noted   Iron deficiency anemia 06/10/2023   History of colon polyps 11/15/2018   Cervical radiculitis 12/30/2014   DDD (degenerative disc disease), cervical 12/30/2014   Other cervical disc degeneration, unspecified cervical region 12/30/2014   Male erectile dysfunction 12/28/2012   History of bladder cancer 12/28/2012   Personal history of prostate cancer 12/28/2012     Past Medical History:  Diagnosis Date   Abnormal findings on cytological and histological examination of urine 06/04/2015   Abnormal urine cytology 06/04/2015   Bladder cancer (HCC)    Cervical radiculitis 12/30/2014   Colon polyps    DDD (degenerative disc disease), cervical 12/30/2014   Diabetes mellitus without complication (HCC)    Diverticulosis    ED (erectile dysfunction) of organic origin 12/28/2012   Hemorrhoids    History of bladder cancer 12/28/2012   History of kidney stones    Hyperlipidemia    Hypertension    Male erectile dysfunction 12/28/2012   Microcytic hypochromic anemia    Microscopic hematuria 06/03/2015   Non-recurrent unilateral inguinal hernia without obstruction or gangrene    Other cervical disc degeneration, unspecified cervical region 12/30/2014  Personal history of prostate cancer 12/28/2012   Prostate cancer [facility])      Past Surgical History:  Procedure Laterality Date   APPENDECTOMY     CHOLECYSTECTOMY     COLONOSCOPY WITH PROPOFOL N/A 11/14/2018   Procedure: COLONOSCOPY WITH PROPOFOL;  Surgeon: Christena Deem, MD;  Location[facility]  ENDOSCOPY;  Service: Endoscopy;  Laterality: N/A;   CYSTOSCOPY WITH BIOPSY     Laproscopic Prostatectomy     Removal of Nasal Polyps      Social History   Socioeconomic History   Marital status: Widowed    Spouse name: Not on file   Number of children: Not on file   Years of education: Not on file   Highest education level: Not on file  Occupational History   Not on file  Tobacco Use   Smoking status: Former   Smokeless tobacco: Never  Vaping Use   Vaping status: Never Used  Substance and Sexual Activity   Alcohol use: Never   Drug use: Never   Sexual activity: Not Currently    Birth control/protection: None  Other Topics Concern   Not on file  Social History Narrative   Lives alone, has 2 dogs   Social Determinants of Health   Financial Resource Strain: Not on file  Food Insecurity: Not on file  Transportation Needs: Not on file  Physical Activity: Not on file  Stress: Not on file  Social Connections: Not on file  Intimate Partner Violence: Not on file     Family History  Problem Relation Age of Onset   Diabetes Mother    Hypertension Mother    Osteoporosis Mother    Breast cancer Mother    Prostate cancer Father      Current Outpatient Medications:    amLODipine (NORVASC) 10 MG tablet, Take 10 mg by mouth., Disp: , Rfl:    atorvastatin (LIPITOR) 20 MG tablet, Take 40 mg by mouth., Disp: , Rfl:    azelastine (ASTELIN) 0.1 % nasal spray, , Disp: , Rfl:    fluticasone (FLONASE) 50 MCG/ACT nasal spray, as needed., Disp: , Rfl:    glipiZIDE (GLUCOTROL) 5 MG tablet, Take 5 mg by mouth daily., Disp: , Rfl:    HYDROcodone-acetaminophen (NORCO/VICODIN) 5-325 MG tablet, every 6 (six) hours as needed., Disp: , Rfl:    Iron-Vitamin C 65-125 MG TABS, Take 1 tablet by mouth daily., Disp: 30 tablet, Rfl: 3   ketoconazole (NIZORAL) 2 % cream, Apply topically to rash 1-2 times daily for 2-4 weeks. (Patient taking differently: 2 (two) times daily as needed. Apply topically  to rash 1-2 times daily for 2-4 weeks.), Disp: 60 each, Rfl: 1   lisinopril (PRINIVIL,ZESTRIL) 20 MG tablet, Take 40 mg by mouth., Disp: , Rfl:    magnesium (MAGTAB) 84 MG ( ) TBCR SR tablet, Take 84 mg by mouth daily., Disp: , Rfl:    Melatonin 1 MG TABS, Take 5 mg by mouth at bedtime as needed., Disp: , Rfl:    metFORMIN (GLUCOPHAGE) 500 MG tablet, Take 500 mg by mouth 2 (two) times daily with a meal., Disp: , Rfl:    omeprazole (PRILOSEC) 20 MG capsule, Take 20 mg by mouth as needed., Disp: , Rfl:    phenazopyridine (PYRIDIUM) 200 MG tablet, Take 1 tablet (200 mg total) by mouth 3 (three) times daily as needed for pain., Disp: 10 tablet, Rfl: 0   tamsulosin (FLOMAX) 0.4 MG CAPS capsule, Take 0.4 mg by mouth daily. (Patient not taking: Reported on  06/08/2023), Disp: , Rfl:    Physical exam:  Vitals:   06/17/23 1420  BP: 130/69  Pulse: 70  Resp: 18  Temp: (!) 96.8 F (36 C)  TempSrc: Tympanic  SpO2: 100%  Weight: 157 lb (71.2 kg)  Height: 5\' 6"  (1.676 m)   Physical Exam Cardiovascular:     Rate and Rhythm: Normal rate and regular rhythm.     Heart sounds: Normal heart sounds.  Pulmonary:     Effort: Pulmonary effort is normal.     Breath sounds: Normal breath sounds.  Abdominal:     General: Bowel sounds are normal.     Palpations: Abdomen is soft.  Skin:    General: Skin is warm and dry.  Neurological:     Mental Status: He is alert and oriented to person, place, and time.           Latest Ref Rng & Units 06/09/2023   10:25 AM  CMP  Glucose 70 - 99 mg/dL 161   BUN 8 - 23 mg/dL 18   Creatinine 0.96 - 1.24 mg/dL 0.45   Sodium 409 - 811 mmol/L 139   Potassium 3.5 - 5.1 mmol/L 3.5   Chloride 98 - 111 mmol/L 106   CO2 22 - 32 mmol/L 22   Calcium 8.9 - 10.3 mg/dL 9.2       Latest Ref Rng & Units 06/09/2023   10:25 AM  CBC  WBC 4.0 - 10.5 K/uL 7.1   Hemoglobin 13.0 - 17.0 g/dL 8.3   Hematocrit 91.4 - 52.0 % 28.6   Platelets 150 - 400 K/uL 392     No images  are attached to the encounter.  No results found.  Assessment and plan- Patient is a 82 y.o. male referred for iron deficiency anemia  Patient is significantly anemic with a hemoglobin of 8.3.  Labs reveal iron deficiency anemia.  We discussed IV iron and as per his insurance he will need 5 doses of Venofer.  Discussed risks and benefits of IV iron including all but not limited to possible risk of infusion reactions.  Patient understands and agrees to proceed as planned.  I will repeat CBC ferritin and iron studies 2 months from now and see him thereafter.  With regards to etiology of iron deficiency anemia I discussed the need for GI workup including possible repeat EGD colonoscopy with or without capsule endoscopy.  Patient is 30 and does not wish to undergo any endoscopy evaluation at this time.   Thank you for this kind referral and the opportunity to participate in the care of this  Patient   Visit Diagnosis 1. Other iron deficiency anemia     Dr. Owens Shark, MD, MPH [facility] [phone] 06/17/2023

## 2023-06-19 ENCOUNTER — Encounter: Payer: Self-pay | Admitting: Oncology

## 2023-06-22 ENCOUNTER — Inpatient Hospital Stay: Payer: Medicare PPO | Attending: Oncology

## 2023-06-22 VITALS — BP 141/62 | HR 69 | Temp 97.6°F | Resp 18

## 2023-06-22 DIAGNOSIS — D509 Iron deficiency anemia, unspecified: Secondary | ICD-10-CM | POA: Diagnosis present

## 2023-06-22 DIAGNOSIS — D508 Other iron deficiency anemias: Secondary | ICD-10-CM

## 2023-06-22 MED ORDER — SODIUM CHLORIDE 0.9 % IV SOLN
Freq: Once | INTRAVENOUS | Status: AC
  Filled 2023-06-22: qty 250

## 2023-06-22 MED ORDER — SODIUM CHLORIDE 0.9 % IV SOLN
200.0000 mg | Freq: Once | INTRAVENOUS | Status: AC
  Administered 2023-06-22: 200 mg via INTRAVENOUS
  Filled 2023-06-22: qty 200

## 2023-06-22 NOTE — Patient Instructions (Signed)
[facility]  Discharge Instructions: Thank you for choosing [facility] to provide your oncology and hematology care.  If you have a lab appointment with the Cancer Center, please go directly to the Cancer Center and check in at the registration area.  Wear comfortable clothing and clothing appropriate for easy access to any Portacath or PICC line.   We strive to give you quality time with your provider. You may need to reschedule your appointment if you arrive late (15 or more minutes).  Arriving late affects you and other patients whose appointments are after yours.  Also, if you miss three or more appointments without notifying the office, you may be dismissed from the clinic at the provider's discretion.      For prescription refill requests, have your pharmacy contact our office and allow 72 hours for refills to be completed.    Today you received the following chemotherapy and/or immunotherapy agents Venofer.      To help prevent nausea and vomiting after your treatment, we encourage you to take your nausea medication as directed.  BELOW ARE SYMPTOMS THAT SHOULD BE REPORTED IMMEDIATELY: *FEVER GREATER THAN 100.4 F (38 C) OR HIGHER *CHILLS OR SWEATING *NAUSEA AND VOMITING THAT IS NOT CONTROLLED WITH YOUR NAUSEA MEDICATION *UNUSUAL SHORTNESS OF BREATH *UNUSUAL BRUISING OR BLEEDING *URINARY PROBLEMS (pain or burning when urinating, or frequent urination) *BOWEL PROBLEMS (unusual diarrhea, constipation, pain near the anus) TENDERNESS IN MOUTH AND THROAT WITH OR WITHOUT PRESENCE OF ULCERS (sore throat, sores in mouth, or a toothache) UNUSUAL RASH, SWELLING OR PAIN  UNUSUAL VAGINAL DISCHARGE OR ITCHING   Items with * indicate a potential emergency and should be followed up as soon as possible or go to the Emergency Department if any problems should occur.  Please show the CHEMOTHERAPY ALERT CARD or IMMUNOTHERAPY ALERT CARD at check-in to  the Emergency Department and triage nurse.  Should you have questions after your visit or need to cancel or reschedule your appointment, please contact [facility]  [phone] and follow the prompts.  Office hours are 8:00 a.m. to 4:30 p.m. Monday - Friday. Please note that voicemails left after 4:00 p.m. may not be returned until the following business day.  We are closed weekends and major holidays. You have access to a nurse at all times for urgent questions. Please call the main number to the clinic [phone] and follow the prompts.  For any non-urgent questions, you may also contact your provider using MyChart. We now offer e-Visits for anyone 18 and older to request care online for non-urgent symptoms. For details visit [email or web address].   Also download the MyChart app! Go to the app store, search "MyChart", open the app, select [facility], and log in with your MyChart username and password.   

## 2023-06-24 ENCOUNTER — Inpatient Hospital Stay: Payer: Medicare PPO

## 2023-06-24 VITALS — BP 149/60 | HR 72 | Resp 16

## 2023-06-24 DIAGNOSIS — D509 Iron deficiency anemia, unspecified: Secondary | ICD-10-CM | POA: Diagnosis not present

## 2023-06-24 DIAGNOSIS — D508 Other iron deficiency anemias: Secondary | ICD-10-CM

## 2023-06-24 MED ORDER — SODIUM CHLORIDE 0.9 % IV SOLN
200.0000 mg | Freq: Once | INTRAVENOUS | Status: AC
  Administered 2023-06-24: 200 mg via INTRAVENOUS
  Filled 2023-06-24: qty 200

## 2023-06-24 MED ORDER — SODIUM CHLORIDE 0.9 % IV SOLN
Freq: Once | INTRAVENOUS | Status: AC
  Filled 2023-06-24: qty 250

## 2023-06-27 ENCOUNTER — Inpatient Hospital Stay: Payer: Medicare PPO

## 2023-06-27 VITALS — BP 126/69 | HR 72 | Temp 97.3°F | Resp 18

## 2023-06-27 DIAGNOSIS — D508 Other iron deficiency anemias: Secondary | ICD-10-CM

## 2023-06-27 DIAGNOSIS — D509 Iron deficiency anemia, unspecified: Secondary | ICD-10-CM | POA: Diagnosis not present

## 2023-06-27 MED ORDER — SODIUM CHLORIDE 0.9 % IV SOLN
Freq: Once | INTRAVENOUS | Status: AC
  Filled 2023-06-27: qty 250

## 2023-06-27 MED ORDER — SODIUM CHLORIDE 0.9 % IV SOLN
200.0000 mg | Freq: Once | INTRAVENOUS | Status: AC
  Administered 2023-06-27: 200 mg via INTRAVENOUS
  Filled 2023-06-27: qty 200

## 2023-06-27 NOTE — Patient Instructions (Signed)
Iron Sucrose Injection What is this medication? IRON SUCROSE (EYE ern SOO krose) treats low levels of iron (iron deficiency anemia) in people with kidney disease. Iron is a mineral that plays an important role in making red blood cells, which carry oxygen from your lungs to the rest of your body. This medicine may be used for other purposes; ask your health care provider or pharmacist if you have questions. COMMON BRAND NAME(S): Venofer What should I tell my care team before I take this medication? They need to know if you have any of these conditions: Anemia not caused by low iron levels Heart disease High levels of iron in the blood Kidney disease Liver disease An unusual or allergic reaction to iron, other medications, foods, dyes, or preservatives Pregnant or trying to get pregnant Breastfeeding How should I use this medication? This medication is for infusion into a vein. It is given in a hospital or clinic setting. Talk to your care team about the use of this medication in children. While this medication may be prescribed for children as young as 2 years for selected conditions, precautions do apply. Overdosage: If you think you have taken too much of this medicine contact a poison control center or emergency room at once. NOTE: This medicine is only for you. Do not share this medicine with others. What if I miss a dose? Keep appointments for follow-up doses. It is important not to miss your dose. Call your care team if you are unable to keep an appointment. What may interact with this medication? Do not take this medication with any of the following: Deferoxamine Dimercaprol Other iron products This medication may also interact with the following: Chloramphenicol Deferasirox This list may not describe all possible interactions. Give your health care provider a list of all the medicines, herbs, non-prescription drugs, or dietary supplements you use. Also tell them if you smoke,  drink alcohol, or use illegal drugs. Some items may interact with your medicine. What should I watch for while using this medication? Visit your care team regularly. Tell your care team if your symptoms do not start to get better or if they get worse. You may need blood work done while you are taking this medication. You may need to follow a special diet. Talk to your care team. Foods that contain iron include: whole grains/cereals, dried fruits, beans, or peas, leafy green vegetables, and organ meats (liver, kidney). What side effects may I notice from receiving this medication? Side effects that you should report to your care team as soon as possible: Allergic reactions--skin rash, itching, hives, swelling of the face, lips, tongue, or throat Low blood pressure--dizziness, feeling faint or lightheaded, blurry vision Shortness of breath Side effects that usually do not require medical attention (report to your care team if they continue or are bothersome): Flushing Headache Joint pain Muscle pain Nausea Pain, redness, or irritation at injection site This list may not describe all possible side effects. Call your doctor for medical advice about side effects. You may report side effects to FDA at 1-800-FDA-1088. Where should I keep my medication? This medication is given in a hospital or clinic. It will not be stored at home. NOTE: This sheet is a summary. It may not cover all possible information. If you have questions about this medicine, talk to your doctor, pharmacist, or health care provider.  2024 Elsevier/Gold Standard (2023-03-11 00:00:00)

## 2023-06-29 ENCOUNTER — Inpatient Hospital Stay: Payer: Medicare PPO

## 2023-07-01 ENCOUNTER — Inpatient Hospital Stay: Payer: Medicare PPO

## 2023-07-01 VITALS — BP 162/73 | HR 77 | Temp 97.0°F | Resp 16

## 2023-07-01 DIAGNOSIS — D509 Iron deficiency anemia, unspecified: Secondary | ICD-10-CM | POA: Diagnosis not present

## 2023-07-01 DIAGNOSIS — D508 Other iron deficiency anemias: Secondary | ICD-10-CM

## 2023-07-01 MED ORDER — SODIUM CHLORIDE 0.9 % IV SOLN
200.0000 mg | Freq: Once | INTRAVENOUS | Status: AC
  Administered 2023-07-01: 200 mg via INTRAVENOUS
  Filled 2023-07-01: qty 200

## 2023-07-01 MED ORDER — SODIUM CHLORIDE 0.9 % IV SOLN
Freq: Once | INTRAVENOUS | Status: AC
  Filled 2023-07-01: qty 250

## 2023-07-29 ENCOUNTER — Inpatient Hospital Stay: Payer: Medicare PPO | Attending: Oncology

## 2023-07-29 ENCOUNTER — Ambulatory Visit: Payer: Self-pay | Admitting: General Surgery

## 2023-07-29 DIAGNOSIS — D509 Iron deficiency anemia, unspecified: Secondary | ICD-10-CM | POA: Insufficient documentation

## 2023-07-29 DIAGNOSIS — D508 Other iron deficiency anemias: Secondary | ICD-10-CM

## 2023-07-29 LAB — CBC
HCT: 43 % (ref 39.0–52.0)
Hemoglobin: 13.3 g/dL (ref 13.0–17.0)
MCH: 24.1 pg — ABNORMAL LOW (ref 26.0–34.0)
MCHC: 30.9 g/dL (ref 30.0–36.0)
MCV: 77.8 fL — ABNORMAL LOW (ref 80.0–100.0)
Platelets: 252 10*3/uL (ref 150–400)
RBC: 5.53 MIL/uL (ref 4.22–5.81)
RDW: 28.9 % — ABNORMAL HIGH (ref 11.5–15.5)
WBC: 11.3 10*3/uL — ABNORMAL HIGH (ref 4.0–10.5)
nRBC: 0 % (ref 0.0–0.2)

## 2023-07-29 LAB — IRON AND TIBC
Iron: 65 ug/dL (ref 45–182)
Saturation Ratios: 20 % (ref 17.9–39.5)
TIBC: 328 ug/dL (ref 250–450)
UIBC: 263 ug/dL

## 2023-07-29 LAB — FERRITIN: Ferritin: 45 ng/mL (ref 24–336)

## 2023-07-29 LAB — FOLATE: Folate: 25 ng/mL (ref >5.9)

## 2023-07-29 LAB — VITAMIN B12: Vitamin B-12: 905 pg/mL (ref 180–914)

## 2023-08-02 ENCOUNTER — Inpatient Hospital Stay: Payer: Medicare PPO | Admitting: Oncology

## 2023-08-02 ENCOUNTER — Encounter: Payer: Self-pay | Admitting: Oncology

## 2023-08-02 ENCOUNTER — Encounter
Admission: RE | Admit: 2023-08-02 | Discharge: 2023-08-02 | Disposition: A | Discharge status: Home / Self Care | Payer: Medicare PPO | Source: Ambulatory Visit | Attending: General Surgery | Admitting: General Surgery

## 2023-08-02 DIAGNOSIS — E119 Type 2 diabetes mellitus without complications: Secondary | ICD-10-CM

## 2023-08-02 NOTE — Progress Notes (Unsigned)
Pt in for follow up, states having hernia surgery on 08/10/23.   Established Patient Office Visit  Subjective   Patient ID: DELROY ORDWAY, male    DOB: [birth year]  Age: 82 y.o. MRN: [id number]  Chief Complaint  Patient presents with   Other iron deficiency anemia    HPI  {History (Optional):23778}  ROS    Objective:     There were no vitals taken for this visit. {Vitals History (Optional):23777}  Physical Exam   No results found for any visits on 08/02/23.  {Labs (Optional):23779}  The ASCVD Risk score (Arnett DK, et al., 2019) failed to calculate for the following reasons:   Cannot find a previous HDL lab   Cannot find a previous total cholesterol lab    Assessment & Plan:   Problem List Items Addressed This Visit   None   No follow-ups on file.    Simon Rhein, RN

## 2023-08-02 NOTE — Patient Instructions (Signed)
Your procedure is scheduled on: 08/10/23 - Wednesday Report to the Registration Desk on the 1st floor of the [facility]. To find out your arrival time, please call [phone] between 1PM - 3PM on: 08/09/23 - Tuesday If your arrival time is 6:00 am, do not arrive before that time as the [facility] entrance doors do not open until 6:00 am.  REMEMBER: Instructions that are not followed completely may result in serious medical risk, up to and including death; or upon the discretion of your surgeon and anesthesiologist your surgery may need to be rescheduled.  Do not eat food or drink any liquids after midnight the night before surgery.  No gum chewing or hard candies.  One week prior to surgery: Stop Anti-inflammatories (NSAIDS) such as Advil, Aleve, Ibuprofen, Motrin, Naproxen, Naprosyn and Aspirin based products such as Excedrin, Goody's Powder, BC Powder. You may however, continue to take Tylenol if needed for pain up until the day of surgery.  Stop ANY OVER THE COUNTER supplements until after surgery : Vitamin C, magnesium  Hold Lisinopril on the day of surgery.  Hold metformin beginning 08/08/23.  Hold Jardiance beginning 08/07/23.   ON THE DAY OF SURGERY ONLY TAKE THESE MEDICATIONS WITH SIPS OF WATER:  amLODipine (NORVASC ) omeprazole (PRILOSEC)  HYDROcodone-acetaminophen if needed   No Alcohol for 24 hours before or after surgery.  No Smoking including e-cigarettes for 24 hours before surgery.  No chewable tobacco products for at least 6 hours before surgery.  No nicotine patches on the day of surgery.  Do not use any "recreational" drugs for at least a week (preferably 2 weeks) before your surgery.  Please be advised that the combination of cocaine and anesthesia may have negative outcomes, up to and including death. If you test positive for cocaine, your surgery will be cancelled.  On the morning of surgery brush your teeth with toothpaste and water, you may rinse  your mouth with mouthwash if you wish. Do not swallow any toothpaste or mouthwash.  Use CHG Soap or wipes as directed on instruction sheet.  Do not wear jewelry, make-up, hairpins, clips or nail polish.  For welded (permanent) jewelry: bracelets, anklets, waist bands, etc.  Please have this removed prior to surgery.  If it is not removed, there is a chance that hospital personnel will need to cut it off on the day of surgery.  Do not wear lotions, powders, or perfumes.   Do not shave body hair from the neck down 48 hours before surgery.  Contact lenses, hearing aids and dentures may not be worn into surgery.  Do not bring valuables to the hospital. [facility] is not responsible for any missing/lost belongings or valuables.   Notify your doctor if there is any change in your medical condition (cold, fever, infection).  Wear comfortable clothing (specific to your surgery type) to the hospital.  After surgery, you can help prevent lung complications by doing breathing exercises.  Take deep breaths and cough every 1-2 hours. Your doctor may order a device called an Incentive Spirometer to help you take deep breaths. When coughing or sneezing, hold a pillow firmly against your incision with both hands. This is called "splinting." Doing this helps protect your incision. It also decreases belly discomfort.  If you are being admitted to the hospital overnight, leave your suitcase in the car. After surgery it may be brought to your room.  In case of increased patient census, it may be necessary for you, the patient,  to continue your postoperative care in the Same Day Surgery department.  If you are being discharged the day of surgery, you will not be allowed to drive home. You will need a responsible individual to drive you home and stay with you for 24 hours after surgery.   If you are taking public transportation, you will need to have a responsible individual with you.  Please call the  Pre-admissions Testing Dept. at [phone] if you have any questions about these instructions.  Surgery Visitation Policy:  Patients having surgery or a procedure may have two visitors.  Children under the age of 21 must have an adult with them who is not the patient.  Inpatient Visitation:    Visiting hours are 7 a.m. to 8 p.m. Up to four visitors are allowed at one time in a patient room. The visitors may rotate out with other people during the day.  One visitor age 15 or older may stay with the patient overnight and must be in the room by 8 p.m.     Preparing for Surgery with CHLORHEXIDINE GLUCONATE (CHG) Soap  Chlorhexidine Gluconate (CHG) Soap  o An antiseptic cleaner that kills germs and bonds with the skin to continue killing germs even after washing  o Used for showering the night before surgery and morning of surgery  Before surgery, you can play an important role by reducing the number of germs on your skin.  CHG (Chlorhexidine gluconate) soap is an antiseptic cleanser which kills germs and bonds with the skin to continue killing germs even after washing.  Please do not use if you have an allergy to CHG or antibacterial soaps. If your skin becomes reddened/irritated stop using the CHG.  1. Shower the NIGHT BEFORE SURGERY and the MORNING OF SURGERY with CHG soap.  2. If you choose to wash your hair, wash your hair first as usual with your normal shampoo.  3. After shampooing, rinse your hair and body thoroughly to remove the shampoo.  4. Use CHG as you would any other liquid soap. You can apply CHG directly to the skin and wash gently with a scrungie or a clean washcloth.  5. Apply the CHG soap to your body only from the neck down. Do not use on open wounds or open sores. Avoid contact with your eyes, ears, mouth, and genitals (private parts). Wash face and genitals (private parts) with your normal soap.  6. Wash thoroughly, paying special attention to the area  where your surgery will be performed.  7. Thoroughly rinse your body with warm water.  8. Do not shower/wash with your normal soap after using and rinsing off the CHG soap.  9. Pat yourself dry with a clean towel.  10. Wear clean pajamas to bed the night before surgery.  12. Place clean sheets on your bed the night of your first shower and do not sleep with pets.  13. Shower again with the CHG soap on the day of surgery prior to arriving at the hospital.  14. Do not apply any deodorants/lotions/powders.  15. Please wear clean clothes to the hospital.

## 2023-08-04 ENCOUNTER — Encounter: Payer: Self-pay | Admitting: Oncology

## 2023-08-04 ENCOUNTER — Inpatient Hospital Stay: Payer: Medicare PPO | Admitting: Oncology

## 2023-08-04 ENCOUNTER — Ambulatory Visit: Payer: Self-pay | Admitting: General Surgery

## 2023-08-04 DIAGNOSIS — D509 Iron deficiency anemia, unspecified: Secondary | ICD-10-CM

## 2023-08-04 NOTE — H&P (Signed)
HISTORY OF PRESENT ILLNESS:    Chase Wallace is a 82 y.o.male patient who comes for follow up his right inguinal hernia.    Patient previously evaluated on June 02, 2023 due to symptomatic right middle hernia.  He was scheduled for surgical repair but on preoperative labs he was found with anemia.  Patient was evaluated by hematology and he was found with iron deficiency anemia.  He got iron infusion.  He was basically asymptomatic without shortness of breath, chest pain, no lightheaded symptoms or dizziness.  He was recommended to proceed with further workup to find the cause of the anemia but patient at this time will not like to proceed with any further workup for iron deficiency anemia.  He called back because he feels ready to proceed with surgery.  His new hemoglobin is 13.3.  Patient still having discomfort in the right groin.  He feels a bulge in the right groin.  No pain radiation.  Alleviating factor is resting laying down to reduce the hernia.  Aggravating factor is certain movement of the abdominal wall.  Denies any episode of abdominal distention, nausea or vomiting.  I again reviewed CT scan done on June 2021 that shows right inguinal hernia.      PAST MEDICAL HISTORY:  Past Medical History:  Diagnosis Date   Bladder cancer (CMS/HHS-HCC)    Diagnosed 1987.   Diabetes mellitus type 2, uncomplicated (CMS/HHS-HCC)    History of colon polyps    History of hemorrhoids    Hyperlipidemia    Hypertension    Prostate cancer (CMS/HHS-HCC)    S/p prostatectomy in 1999        PAST SURGICAL HISTORY:   Past Surgical History:  Procedure Laterality Date   LAPAROSCOPIC PROSTATECTOMY  1999   Open release stenosed left thumb flexor sheath  03/12/2009   CHOLECYSTECTOMY  2013   COLONOSCOPY  11/14/2018   Tubular adenoma of the colon/Repeat 47yrs/MUS   APPENDECTOMY     Hemorrhoidal surgery     Removal of nasal polyps     Transurethral resection     with subsequent penile implant.          MEDICATIONS:  Outpatient Encounter Medications as of 08/02/2023  Medication Sig Dispense Refill   alogliptin (NESINA) 25 mg tablet Take 1 tablet by mouth once daily     amLODIPine (NORVASC) 10 MG tablet Take 1 tablet (10 mg total) by mouth     atorvastatin (LIPITOR) 20 MG tablet Take 1 tablet (20 mg total) by mouth     baclofen (LIORESAL) 5 mg tablet 1-2 po bid prn 60 tablet 0   buprenorphine (BUTRANS) 5 mcg/hour PTWK Apply topically.  Change every 7 days. 4 patch 5   empagliflozin (JARDIANCE) 25 mg tablet TAKE ONE TABLET BY MOUTH EVERY DAY FOR TYPE 2 DIABETES MELLITUS     FLUoxetine (PROZAC) 20 MG capsule Take 1 capsule (20 mg total) by mouth once daily     HYDROcodone-acetaminophen (NORCO) 5-325 mg tablet TAKE 1/2 TO 1 TABLET TWICE A DAY 60 tablet 0   lisinopril (PRINIVIL,ZESTRIL) 40 MG tablet Take 1 tablet (40 mg total) by mouth once daily     magnesium L-lactate (MAGTAB) 84 mg TbER SR tablet Take 1 tablet by mouth 2 (two) times daily     melatonin 1 mg tablet Take by mouth     metFORMIN (GLUCOPHAGE) 500 MG tablet Take 1 tablet (500 mg total) by mouth daily with breakfast     omeprazole (PRILOSEC) 20 MG  DR capsule Take 1 capsule (20 mg total) by mouth once daily     naloxone (NARCAN) 4 mg/actuation nasal spray Place 1 spray (4 mg total) into one nostril once as needed (if not breathing or overdose is suspected.) for up to 1 dose Give 2nd dose in 5-10 min if not responding or if sx return for up to 1 dose. 2 each 1   No facility-administered encounter medications on file as of 08/02/2023.     ALLERGIES:   Flunisolide, Nsaids (non-steroidal anti-inflammatory drug), and Tamsulosin   SOCIAL HISTORY:  Social History   Socioeconomic History   Marital status: Widowed  Tobacco Use   Smoking status: Former    Types: Cigarettes    Start date: 09/09/1994   Smokeless tobacco: Never  Vaping Use   Vaping status: Never Used  Substance and Sexual Activity   Alcohol use: No    Alcohol/week:  0.0 standard drinks of alcohol   Drug use: Never   Sexual activity: Defer    FAMILY HISTORY:  Family History  Problem Relation Name Age of Onset   High blood pressure (Hypertension) Mother     Diabetes type II Mother     Osteoporosis (Thinning of bones) Mother     Breast cancer Mother     Prostate cancer Father     Colon cancer Neg Hx     Colon polyps Neg Hx     Stomach cancer Neg Hx       GENERAL REVIEW OF SYSTEMS:   General ROS: negative for - chills, fatigue, fever, weight gain or weight loss Allergy and Immunology ROS: negative for - hives  Hematological and Lymphatic ROS: negative for - bleeding problems or bruising, negative for palpable nodes Endocrine ROS: negative for - heat or cold intolerance, hair changes Respiratory ROS: negative for - cough, shortness of breath or wheezing Cardiovascular ROS: no chest pain or palpitations GI ROS: negative for nausea, vomiting, abdominal pain, diarrhea, constipation Musculoskeletal ROS: negative for - joint swelling or muscle pain Neurological ROS: negative for - confusion, syncope Dermatological ROS: negative for pruritus and rash  PHYSICAL EXAM:  Vitals:   08/02/23 1406  BP: (!) 176/77  Pulse: 74  .  Ht:165.1 cm (5\' 5" ) Wt:66.2 kg (146 lb) KGM:WNUU surface area is 1.74 meters squared. Body mass index is 24.3 kg/m.Marland Kitchen   GENERAL: Alert, active, oriented x3  HEENT: Pupils equal reactive to light. Extraocular movements are intact. Sclera clear. Palpebral conjunctiva normal red color.Pharynx clear.  NECK: Supple with no palpable mass and no adenopathy.  LUNGS: Sound clear with no rales rhonchi or wheezes.  HEART: Regular rhythm S1 and S2 without murmur.  ABDOMEN: Soft and depressible, nontender with no palpable mass, no hepatomegaly.  Right inguinal hernia, large, reducible.  EXTREMITIES: Well-developed well-nourished symmetrical with no dependent edema.  NEUROLOGICAL: Awake alert oriented, facial expression symmetrical,  moving all extremities.      IMPRESSION:     Non-recurrent unilateral inguinal hernia without obstruction or gangrene [K40.90] Chase Wallace is a 82 y.o. male presenting for consultation for right inguinal hernia.     The patient presents with a symptomatic, reducible inguinal hernia. Patient was oriented about the diagnosis of inguinal hernia and its implication. The patient was oriented about the treatment alternatives (observation vs surgical repair). Due to patient symptoms, repair is recommended. Patient oriented about the surgical procedure, the use of mesh and its risk of complications such as: infection, bleeding, injury to vas deference, vasculature and testicle, injury to bowel  or bladder, and chronic pain.    Patient oriented again that due to previous prostatectomy and penile implant, I recommend to proceed with open right inguinal hernia repair.  The patient reported he understood.       Anemia has resolved and patient feels ready to proceed with surgery at this time.   PLAN:  Right inguinal hernia repair with mesh            Avoid taking aspirin or blood thinner 5 days before surgery Contact us if you have any concern.   Patient verbalized understanding, all questions were answered, and were agreeable with the plan outlined above.   Carolan Shiver, MD  Electronically signed by Carolan Shiver, MD

## 2023-08-04 NOTE — H&P (View-Only) (Signed)
HISTORY OF PRESENT ILLNESS:    [name] is a 82 y.o.male patient who comes for follow up his right inguinal hernia.    Patient previously evaluated on June 02, 2023 due to symptomatic right middle hernia.  He was scheduled for surgical repair but on preoperative labs he was found with anemia.  Patient was evaluated by hematology and he was found with iron deficiency anemia.  He got iron infusion.  He was basically asymptomatic without shortness of breath, chest pain, no lightheaded symptoms or dizziness.  He was recommended to proceed with further workup to find the cause of the anemia but patient at this time will not like to proceed with any further workup for iron deficiency anemia.  He called back because he feels ready to proceed with surgery.  His new hemoglobin is 13.3.  Patient still having discomfort in the right groin.  He feels a bulge in the right groin.  No pain radiation.  Alleviating factor is resting laying down to reduce the hernia.  Aggravating factor is certain movement of the abdominal wall.  Denies any episode of abdominal distention, nausea or vomiting.  I again reviewed CT scan done on June 2021 that shows right inguinal hernia.      PAST MEDICAL HISTORY:  Past Medical History:  Diagnosis Date   Bladder cancer (CMS/HHS-HCC)    Diagnosed 1987.   Diabetes mellitus type 2, uncomplicated (CMS/HHS-HCC)    History of colon polyps    History of hemorrhoids    Hyperlipidemia    Hypertension    Prostate cancer (CMS/HHS-HCC)    S/p prostatectomy in 1999        PAST SURGICAL HISTORY:   Past Surgical History:  Procedure Laterality Date   LAPAROSCOPIC PROSTATECTOMY  1999   Open release stenosed left thumb flexor sheath  03/12/2009   CHOLECYSTECTOMY  2013   COLONOSCOPY  11/14/2018   Tubular adenoma of the colon/Repeat 47yrs/MUS   APPENDECTOMY     Hemorrhoidal surgery     Removal of nasal polyps     Transurethral resection     with subsequent penile implant.          MEDICATIONS:  Outpatient Encounter Medications as of 08/02/2023  Medication Sig Dispense Refill   alogliptin (NESINA) 25 mg tablet Take 1 tablet by mouth once daily     amLODIPine (NORVASC) 10 MG tablet Take 1 tablet (10 mg total) by mouth     atorvastatin (LIPITOR) 20 MG tablet Take 1 tablet (20 mg total) by mouth     baclofen (LIORESAL) 5 mg tablet 1-2 po bid prn 60 tablet 0   buprenorphine (BUTRANS) 5 mcg/hour PTWK Apply topically.  Change every 7 days. 4 patch 5   empagliflozin (JARDIANCE) 25 mg tablet TAKE ONE TABLET BY MOUTH EVERY DAY FOR TYPE 2 DIABETES MELLITUS     FLUoxetine (PROZAC) 20 MG capsule Take 1 capsule (20 mg total) by mouth once daily     HYDROcodone-acetaminophen (NORCO) 5-325 mg tablet TAKE 1/2 TO 1 TABLET TWICE A DAY 60 tablet 0   lisinopril (PRINIVIL,ZESTRIL) 40 MG tablet Take 1 tablet (40 mg total) by mouth once daily     magnesium L-lactate (MAGTAB) 84 mg TbER SR tablet Take 1 tablet by mouth 2 (two) times daily     melatonin 1 mg tablet Take by mouth     metFORMIN (GLUCOPHAGE) 500 MG tablet Take 1 tablet (500 mg total) by mouth daily with breakfast     omeprazole (PRILOSEC) 20 MG  DR capsule Take 1 capsule (20 mg total) by mouth once daily     naloxone (NARCAN) 4 mg/actuation nasal spray Place 1 spray (4 mg total) into one nostril once as needed (if not breathing or overdose is suspected.) for up to 1 dose Give 2nd dose in 5-10 min if not responding or if sx return for up to 1 dose. 2 each 1   No facility-administered encounter medications on file as of 08/02/2023.     ALLERGIES:   Flunisolide, Nsaids (non-steroidal anti-inflammatory drug), and Tamsulosin   SOCIAL HISTORY:  Social History   Socioeconomic History   Marital status: Widowed  Tobacco Use   Smoking status: Former    Types: Cigarettes    Start date: 09/09/1994   Smokeless tobacco: Never  Vaping Use   Vaping status: Never Used  Substance and Sexual Activity   Alcohol use: No    Alcohol/week:  0.0 standard drinks of alcohol   Drug use: Never   Sexual activity: Defer    FAMILY HISTORY:  Family History  Problem Relation Name Age of Onset   High blood pressure (Hypertension) Mother     Diabetes type II Mother     Osteoporosis (Thinning of bones) Mother     Breast cancer Mother     Prostate cancer Father     Colon cancer Neg Hx     Colon polyps Neg Hx     Stomach cancer Neg Hx       GENERAL REVIEW OF SYSTEMS:   General ROS: negative for - chills, fatigue, fever, weight gain or weight loss Allergy and Immunology ROS: negative for - hives  Hematological and Lymphatic ROS: negative for - bleeding problems or bruising, negative for palpable nodes Endocrine ROS: negative for - heat or cold intolerance, hair changes Respiratory ROS: negative for - cough, shortness of breath or wheezing Cardiovascular ROS: no chest pain or palpitations GI ROS: negative for nausea, vomiting, abdominal pain, diarrhea, constipation Musculoskeletal ROS: negative for - joint swelling or muscle pain Neurological ROS: negative for - confusion, syncope Dermatological ROS: negative for pruritus and rash  PHYSICAL EXAM:  Vitals:   08/02/23 1406  BP: (!) 176/77  Pulse: 74  .  Ht:165.1 cm (5\' 5" ) Wt:66.2 kg (146 lb) KGM:WNUU surface area is 1.74 meters squared. Body mass index is 24.3 kg/m.Marland Kitchen   GENERAL: Alert, active, oriented x3  HEENT: Pupils equal reactive to light. Extraocular movements are intact. Sclera clear. Palpebral conjunctiva normal red color.Pharynx clear.  NECK: Supple with no palpable mass and no adenopathy.  LUNGS: Sound clear with no rales rhonchi or wheezes.  HEART: Regular rhythm S1 and S2 without murmur.  ABDOMEN: Soft and depressible, nontender with no palpable mass, no hepatomegaly.  Right inguinal hernia, large, reducible.  EXTREMITIES: Well-developed well-nourished symmetrical with no dependent edema.  NEUROLOGICAL: Awake alert oriented, facial expression symmetrical,  moving all extremities.      IMPRESSION:     Non-recurrent unilateral inguinal hernia without obstruction or gangrene [K40.90] Mr. Chase Wallace is a 82 y.o. male presenting for consultation for right inguinal hernia.     The patient presents with a symptomatic, reducible inguinal hernia. Patient was oriented about the diagnosis of inguinal hernia and its implication. The patient was oriented about the treatment alternatives (observation vs surgical repair). Due to patient symptoms, repair is recommended. Patient oriented about the surgical procedure, the use of mesh and its risk of complications such as: infection, bleeding, injury to vas deference, vasculature and testicle, injury to bowel  or bladder, and chronic pain.    Patient oriented again that due to previous prostatectomy and penile implant, I recommend to proceed with open right inguinal hernia repair.  The patient reported he understood.       Anemia has resolved and patient feels ready to proceed with surgery at this time.   PLAN:  Right inguinal hernia repair with mesh            Avoid taking aspirin or blood thinner 5 days before surgery Contact us if you have any concern.   Patient verbalized understanding, all questions were answered, and were agreeable with the plan outlined above.   Carolan Shiver, MD  Electronically signed by Carolan Shiver, MD

## 2023-08-04 NOTE — Telephone Encounter (Signed)
Called patient. Will switch encounter over to a telephone encounter. Patient having technical difficulties and not sure how to operate the app and microphone/video options on his phone. He prefers to be reached by phone at [phone]

## 2023-08-06 ENCOUNTER — Encounter: Payer: Self-pay | Admitting: Oncology

## 2023-08-06 NOTE — Progress Notes (Addendum)
I connected with Chase Wallace on 08/04/23 at  9:00 AM EDT by telephone visit and verified that I am speaking with the correct person using two identifiers.   I discussed the limitations, risks, security and privacy concerns of performing an evaluation and management service by telemedicine and the availability of in-person appointments. I also discussed with the patient that there may be a patient responsible charge related to this service. The patient expressed understanding and agreed to proceed.  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  He is status post radical prostatectomy.  Subsequently he had a rise in his PSA sometime in 2021 and underwent radiation at the [place].  He has now been referred for iron deficiency anemia.  Patient was supposed to go for hernia surgery with Dr. Maia Plan and as a part of his preoperative workup his H&H was found to be 8.3/28.6 with an MCV of 65.  Ferritin levels were low at 6 and iron saturation 5%.  Patient denies any blood loss in his stool or urine.  Denies any dark melanotic stools.  He has undergone colonoscopy with [facility] back in 2020 which did not show any evidence of bleeding.  Also states that he has gone through EGD in the past.  He has never had a capsule endoscopy.    Interval history patient reports improvement in his energy levels after receiving IV iron in September 2024.  Denies any blood loss in his stool or urine.   Review of Systems  Constitutional:  Negative for chills, fever, malaise/fatigue and weight loss.  HENT:  Negative for congestion, ear discharge and nosebleeds.   Eyes:  Negative for blurred vision.  Respiratory:  Negative for cough, hemoptysis, sputum  production, shortness of breath and wheezing.   Cardiovascular:  Negative for chest pain, palpitations, orthopnea and claudication.  Gastrointestinal:  Negative for abdominal pain, blood in stool, constipation, diarrhea, heartburn, melena, nausea and vomiting.  Genitourinary:  Negative for dysuria, flank pain, frequency, hematuria and urgency.  Musculoskeletal:  Negative for back pain, joint pain and myalgias.  Skin:  Negative for rash.  Neurological:  Negative for dizziness, tingling, focal weakness, seizures, weakness and headaches.  Endo/Heme/Allergies:  Does not bruise/bleed easily.  Psychiatric/Behavioral:  Negative for depression and suicidal ideas. The patient does not have insomnia.     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/2012   Hemorrhoids    History of bladder cancer 12/28/2012   History of kidney stones    Hyperlipidemia    Hypertension    Male erectile dysfunction 12/28/2012   Microcytic hypochromic anemia    Microscopic hematuria 06/03/2015   Non-recurrent unilateral inguinal hernia without obstruction or gangrene    Other cervical disc degeneration, unspecified cervical  region 12/30/2014   Personal history of prostate cancer 12/28/2012   Prostate cancer [facility])     Past Surgical History:  Procedure Laterality Date   APPENDECTOMY     CHOLECYSTECTOMY     COLONOSCOPY WITH PROPOFOL N/A 11/14/2018   Procedure: COLONOSCOPY WITH PROPOFOL;  Surgeon: Christena Deem, MD;  Location: [facility] ENDOSCOPY;   Service: Endoscopy;  Laterality: N/A;   CYSTOSCOPY WITH BIOPSY     Laproscopic Prostatectomy     Removal of Nasal Polyps      Social History   Socioeconomic History   Marital status: Widowed    Spouse name: Not on file   Number of children: Not on file   Years of education: Not on file   Highest education level: Not on file  Occupational History   Not on file  Tobacco Use   Smoking status: Former   Smokeless tobacco: Never  Vaping Use   Vaping status: Never Used  Substance and Sexual Activity   Alcohol use: Never   Drug use: Never   Sexual activity: Not Currently    Birth control/protection: None  Other Topics Concern   Not on file  Social History Narrative   Lives alone, has 2 dogs   Social Determinants of Health   Financial Resource Strain: Not on file  Food Insecurity: Not on file  Transportation Needs: Not on file  Physical Activity: Not on file  Stress: Not on file  Social Connections: Not on file  Intimate Partner Violence: Not on file    Family History  Problem Relation Age of Onset   Diabetes Mother    Hypertension Mother    Osteoporosis Mother    Breast cancer Mother    Prostate cancer Father      Current Outpatient Medications:    acetaminophen (TYLENOL) 500 MG tablet, Take 1,000 mg by mouth every 6 (six) hours as needed., Disp: , Rfl:    amLODipine (NORVASC) 10 MG tablet, Take 10 mg by mouth., Disp: , Rfl:    Ascorbic Acid (VITA-C PO), Take 1 tablet by mouth as needed. 500 mg, Disp: , Rfl:    atorvastatin (LIPITOR) 20 MG tablet, Take 40 mg by mouth., Disp: , Rfl:    azelastine (ASTELIN) 0.1 % nasal spray, , Disp: , Rfl:    cyanocobalamin (VITAMIN B12) 1000 MCG tablet, Take 1,000 mcg by mouth daily., Disp: , Rfl:    docusate sodium (COLACE) 100 MG capsule, Take 100 mg by mouth 2 (two) times daily as needed for mild constipation., Disp: , Rfl:    empagliflozin (JARDIANCE) 10 MG TABS tablet, Take 10 mg by mouth daily., Disp: , Rfl:    fluticasone  (FLONASE) 50 MCG/ACT nasal spray, 1 spray as needed., Disp: , Rfl:    HYDROcodone-acetaminophen (NORCO/VICODIN) 5-325 MG tablet, every 6 (six) hours as needed. (Patient not taking: Reported on 08/02/2023), Disp: , Rfl:    Iron-Vitamin C 65-125 MG TABS, Take 1 tablet by mouth daily. (Patient not taking: Reported on 08/02/2023), Disp: 30 tablet, Rfl: 3   ketoconazole (NIZORAL) 2 % cream, Apply topically to rash 1-2 times daily for 2-4 weeks. (Patient not taking: Reported on 08/02/2023), Disp: 60 each, Rfl: 1   lisinopril (PRINIVIL,ZESTRIL) 20 MG tablet, Take 40 mg by mouth., Disp: , Rfl:    magnesium (MAGTAB) 84 MG ( ) TBCR SR tablet, Take 84 mg by mouth daily., Disp: , Rfl:    Melatonin 1 MG TABS, Take 5 mg by mouth at bedtime as needed., Disp: , Rfl:  metFORMIN (GLUCOPHAGE) 500 MG tablet, Take 500 mg by mouth 2 (two) times daily with a meal., Disp: , Rfl:    omeprazole (PRILOSEC) 20 MG capsule, Take 20 mg by mouth as needed., Disp: , Rfl:    phenazopyridine (PYRIDIUM) 200 MG tablet, Take 1 tablet (200 mg total) by mouth 3 (three) times daily as needed for pain. (Patient not taking: Reported on 08/02/2023), Disp: 10 tablet, Rfl: 0   tamsulosin (FLOMAX) 0.4 MG CAPS capsule, Take 0.4 mg by mouth daily. (Patient not taking: Reported on 06/08/2023), Disp: , Rfl:   No results found.  No images are attached to the encounter.      Latest Ref Rng & Units 06/09/2023   10:25 AM  CMP  Glucose 70 - 99 mg/dL 161   BUN 8 - 23 mg/dL 18   Creatinine 0.96 - 1.24 mg/dL 0.45   Sodium 409 - 811 mmol/L 139   Potassium 3.5 - 5.1 mmol/L 3.5   Chloride 98 - 111 mmol/L 106   CO2 22 - 32 mmol/L 22   Calcium 8.9 - 10.3 mg/dL 9.2       Latest Ref Rng & Units 07/29/2023   12:42 PM  CBC  WBC 4.0 - 10.5 K/uL 11.3   Hemoglobin 13.0 - 17.0 g/dL 91.4   Hematocrit 78.2 - 52.0 % 43.0   Platelets 150 - 400 K/uL 252      Assessment and plan: Patient is a 82 year old male and this is a routine follow-up Visit  for iron deficiency anemia  After receiving IV iron patient's hemoglobin is improved from 8.3 presently to 13.3.  Ferritin levels are normal at 45 with an iron saturation of 20%.  B12 and folate levels are also normal.  Patient does not require any IV iron at this time.  Repeat CBC ferritin and iron studies in 3 and 6 months and I will see him back in 6 months  Follow-up instructions:as above  I discussed the assessment and treatment plan with the patient. The patient was provided an opportunity to ask questions and all were answered. The patient agreed with the plan and demonstrated an understanding of the instructions.   The patient was advised to call back or seek an in-person evaluation if the symptoms worsen or if the condition fails to improve as anticipated.  I provided 11 minutes of non face-to-face telephone visit time during this encounter, and > 50% was spent counseling as documented under my assessment & plan.  Visit Diagnosis: No diagnosis found.  Dr. Owens Shark, MD, MPH [facility] at [facility] Tel- [phone] 08/06/2023 4:32 PM

## 2023-08-09 MED ORDER — SODIUM CHLORIDE 0.9 % IV SOLN: INTRAVENOUS | Status: DC

## 2023-08-09 MED ORDER — CEFAZOLIN SODIUM-DEXTROSE 2-4 GM/100ML-% IV SOLN
2.0000 g | INTRAVENOUS | Status: AC
  Administered 2023-08-10: 2 g via INTRAVENOUS

## 2023-08-09 MED ORDER — CHLORHEXIDINE GLUCONATE 0.12 % MT SOLN
15.0000 mL | Freq: Once | OROMUCOSAL | Status: AC
  Administered 2023-08-10: 15 mL via OROMUCOSAL

## 2023-08-09 MED ORDER — ORAL CARE MOUTH RINSE: 15.0000 mL | Freq: Once | OROMUCOSAL | Status: AC

## 2023-08-10 ENCOUNTER — Ambulatory Visit
Admission: RE | Admit: 2023-08-10 | Discharge: 2023-08-10 | Disposition: A | Discharge status: Home / Self Care | Payer: Medicare PPO | Attending: General Surgery | Admitting: General Surgery

## 2023-08-10 ENCOUNTER — Encounter: Payer: Self-pay | Admitting: General Surgery

## 2023-08-10 ENCOUNTER — Ambulatory Visit: Payer: Medicare PPO | Admitting: Urgent Care

## 2023-08-10 ENCOUNTER — Encounter
Admission: RE | Disposition: A | Discharge status: Home / Self Care | Payer: Self-pay | Source: Home / Self Care | Attending: General Surgery

## 2023-08-10 ENCOUNTER — Other Ambulatory Visit: Payer: Self-pay

## 2023-08-10 DIAGNOSIS — E119 Type 2 diabetes mellitus without complications: Secondary | ICD-10-CM | POA: Diagnosis not present

## 2023-08-10 DIAGNOSIS — K4090 Unilateral inguinal hernia, without obstruction or gangrene, not specified as recurrent: Secondary | ICD-10-CM | POA: Insufficient documentation

## 2023-08-10 DIAGNOSIS — Z9049 Acquired absence of other specified parts of digestive tract: Secondary | ICD-10-CM | POA: Diagnosis not present

## 2023-08-10 DIAGNOSIS — Z8546 Personal history of malignant neoplasm of prostate: Secondary | ICD-10-CM | POA: Insufficient documentation

## 2023-08-10 DIAGNOSIS — I10 Essential (primary) hypertension: Secondary | ICD-10-CM | POA: Insufficient documentation

## 2023-08-10 DIAGNOSIS — Z87891 Personal history of nicotine dependence: Secondary | ICD-10-CM | POA: Diagnosis not present

## 2023-08-10 DIAGNOSIS — Z8042 Family history of malignant neoplasm of prostate: Secondary | ICD-10-CM | POA: Diagnosis not present

## 2023-08-10 DIAGNOSIS — Z9079 Acquired absence of other genital organ(s): Secondary | ICD-10-CM | POA: Diagnosis not present

## 2023-08-10 DIAGNOSIS — Z8719 Personal history of other diseases of the digestive system: Secondary | ICD-10-CM | POA: Insufficient documentation

## 2023-08-10 DIAGNOSIS — E785 Hyperlipidemia, unspecified: Secondary | ICD-10-CM | POA: Diagnosis not present

## 2023-08-10 DIAGNOSIS — Z8249 Family history of ischemic heart disease and other diseases of the circulatory system: Secondary | ICD-10-CM | POA: Insufficient documentation

## 2023-08-10 DIAGNOSIS — Z860101 Personal history of adenomatous and serrated colon polyps: Secondary | ICD-10-CM | POA: Insufficient documentation

## 2023-08-10 DIAGNOSIS — Z7984 Long term (current) use of oral hypoglycemic drugs: Secondary | ICD-10-CM | POA: Insufficient documentation

## 2023-08-10 DIAGNOSIS — Z803 Family history of malignant neoplasm of breast: Secondary | ICD-10-CM | POA: Diagnosis not present

## 2023-08-10 DIAGNOSIS — Z8551 Personal history of malignant neoplasm of bladder: Secondary | ICD-10-CM | POA: Diagnosis not present

## 2023-08-10 HISTORY — PX: INGUINAL HERNIA REPAIR: SHX194

## 2023-08-10 HISTORY — PX: INSERTION OF MESH: SHX5868

## 2023-08-10 LAB — GLUCOSE, CAPILLARY
Glucose-Capillary: 150 mg/dL — ABNORMAL HIGH (ref 70–99)
Glucose-Capillary: 196 mg/dL — ABNORMAL HIGH (ref 70–99)

## 2023-08-10 SURGERY — REPAIR, HERNIA, INGUINAL, ADULT
Anesthesia: General | Site: Inguinal | Laterality: Right

## 2023-08-10 MED ORDER — OXYCODONE HCL 5 MG PO TABS
5.0000 mg | ORAL_TABLET | Freq: Once | ORAL | Status: AC | PRN
  Administered 2023-08-10: 5 mg via ORAL

## 2023-08-10 MED ORDER — SUGAMMADEX SODIUM 200 MG/2ML IV SOLN
INTRAVENOUS | Status: DC | PRN
  Administered 2023-08-10: 200 mg via INTRAVENOUS

## 2023-08-10 MED ORDER — KETOROLAC TROMETHAMINE 30 MG/ML IJ SOLN
INTRAMUSCULAR | Status: DC | PRN
  Administered 2023-08-10: 15 mg via INTRAVENOUS

## 2023-08-10 MED ORDER — CEFAZOLIN SODIUM-DEXTROSE 2-4 GM/100ML-% IV SOLN
INTRAVENOUS | Status: AC
  Filled 2023-08-10: qty 100

## 2023-08-10 MED ORDER — PROPOFOL 10 MG/ML IV BOLUS
INTRAVENOUS | Status: AC
  Filled 2023-08-10: qty 40

## 2023-08-10 MED ORDER — OXYCODONE HCL 5 MG/5ML PO SOLN: 5.0000 mg | Freq: Once | ORAL | Status: AC | PRN

## 2023-08-10 MED ORDER — KETOROLAC TROMETHAMINE 30 MG/ML IJ SOLN
INTRAMUSCULAR | Status: AC
  Filled 2023-08-10: qty 1

## 2023-08-10 MED ORDER — BUPIVACAINE-EPINEPHRINE 0.25% -1:200000 IJ SOLN
INTRAMUSCULAR | Status: DC | PRN
  Administered 2023-08-10: 30 mL

## 2023-08-10 MED ORDER — PHENYLEPHRINE 80 MCG/ML (10ML) SYRINGE FOR IV PUSH (FOR BLOOD PRESSURE SUPPORT)
PREFILLED_SYRINGE | INTRAVENOUS | Status: AC
  Filled 2023-08-10: qty 10

## 2023-08-10 MED ORDER — ONDANSETRON HCL 4 MG/2ML IJ SOLN
INTRAMUSCULAR | Status: AC
  Filled 2023-08-10: qty 2

## 2023-08-10 MED ORDER — PROPOFOL 10 MG/ML IV BOLUS
INTRAVENOUS | Status: DC | PRN
  Administered 2023-08-10: 160 mg via INTRAVENOUS

## 2023-08-10 MED ORDER — ACETAMINOPHEN 10 MG/ML IV SOLN
INTRAVENOUS | Status: AC
  Filled 2023-08-10: qty 100

## 2023-08-10 MED ORDER — DEXAMETHASONE SODIUM PHOSPHATE 10 MG/ML IJ SOLN
INTRAMUSCULAR | Status: DC | PRN
  Administered 2023-08-10: 5 mg via INTRAVENOUS

## 2023-08-10 MED ORDER — FENTANYL CITRATE (PF) 100 MCG/2ML IJ SOLN
INTRAMUSCULAR | Status: AC
  Filled 2023-08-10: qty 2

## 2023-08-10 MED ORDER — CHLORHEXIDINE GLUCONATE 0.12 % MT SOLN
OROMUCOSAL | Status: AC
  Filled 2023-08-10: qty 15

## 2023-08-10 MED ORDER — PHENYLEPHRINE 80 MCG/ML (10ML) SYRINGE FOR IV PUSH (FOR BLOOD PRESSURE SUPPORT)
PREFILLED_SYRINGE | INTRAVENOUS | Status: DC | PRN
  Administered 2023-08-10: 80 ug via INTRAVENOUS

## 2023-08-10 MED ORDER — DEXMEDETOMIDINE HCL IN NACL 80 MCG/20ML IV SOLN
INTRAVENOUS | Status: DC | PRN
  Administered 2023-08-10: 8 ug via INTRAVENOUS

## 2023-08-10 MED ORDER — OXYCODONE HCL 5 MG PO TABS
ORAL_TABLET | ORAL | Status: AC
  Filled 2023-08-10: qty 1

## 2023-08-10 MED ORDER — ROCURONIUM BROMIDE 100 MG/10ML IV SOLN
INTRAVENOUS | Status: DC | PRN
  Administered 2023-08-10: 10 mg via INTRAVENOUS
  Administered 2023-08-10: 50 mg via INTRAVENOUS

## 2023-08-10 MED ORDER — ACETAMINOPHEN 10 MG/ML IV SOLN
INTRAVENOUS | Status: DC | PRN
  Administered 2023-08-10: 1000 mg via INTRAVENOUS

## 2023-08-10 MED ORDER — ONDANSETRON HCL 4 MG/2ML IJ SOLN
INTRAMUSCULAR | Status: DC | PRN
  Administered 2023-08-10: 4 mg via INTRAVENOUS

## 2023-08-10 MED ORDER — 0.9 % SODIUM CHLORIDE (POUR BTL) OPTIME
TOPICAL | Status: DC | PRN
  Administered 2023-08-10: 500 mL

## 2023-08-10 MED ORDER — EPHEDRINE SULFATE-NACL 50-0.9 MG/10ML-% IV SOSY
PREFILLED_SYRINGE | INTRAVENOUS | Status: DC | PRN
  Administered 2023-08-10 (×2): 5 mg via INTRAVENOUS

## 2023-08-10 MED ORDER — EPHEDRINE 5 MG/ML INJ
INTRAVENOUS | Status: AC
  Filled 2023-08-10: qty 5

## 2023-08-10 MED ORDER — HYDROCODONE-ACETAMINOPHEN 5-325 MG PO TABS: 1.0000 | ORAL_TABLET | ORAL | 0 refills | Status: AC | PRN

## 2023-08-10 MED ORDER — BUPIVACAINE-EPINEPHRINE (PF) 0.25% -1:200000 IJ SOLN
INTRAMUSCULAR | Status: AC
  Filled 2023-08-10: qty 30

## 2023-08-10 MED ORDER — DEXAMETHASONE SODIUM PHOSPHATE 10 MG/ML IJ SOLN
INTRAMUSCULAR | Status: AC
  Filled 2023-08-10: qty 1

## 2023-08-10 MED ORDER — FENTANYL CITRATE (PF) 100 MCG/2ML IJ SOLN
25.0000 ug | INTRAMUSCULAR | Status: DC | PRN
  Administered 2023-08-10 (×2): 25 ug via INTRAVENOUS

## 2023-08-10 MED ORDER — FENTANYL CITRATE (PF) 100 MCG/2ML IJ SOLN
INTRAMUSCULAR | Status: DC | PRN
  Administered 2023-08-10 (×2): 50 ug via INTRAVENOUS

## 2023-08-10 MED ORDER — LIDOCAINE HCL (PF) 2 % IJ SOLN
INTRAMUSCULAR | Status: AC
  Filled 2023-08-10: qty 5

## 2023-08-10 MED ORDER — LIDOCAINE HCL (CARDIAC) PF 100 MG/5ML IV SOSY
PREFILLED_SYRINGE | INTRAVENOUS | Status: DC | PRN
  Administered 2023-08-10: 80 mg via INTRAVENOUS

## 2023-08-10 SURGICAL SUPPLY — 36 items
ADH SKN CLS APL DERMABOND .7 (GAUZE/BANDAGES/DRESSINGS) ×2
APL PRP STRL LF DISP 70% ISPRP (MISCELLANEOUS) ×2
BAG DRN RND TRDRP ANRFLXCHMBR (UROLOGICAL SUPPLIES) ×2
BAG URINE DRAIN 2000ML AR STRL (UROLOGICAL SUPPLIES) IMPLANT
BLADE SURG 15 STRL LF DISP TIS (BLADE) ×2 IMPLANT
BLADE SURG 15 STRL SS (BLADE) ×2
CHLORAPREP W/TINT 26 (MISCELLANEOUS) ×2 IMPLANT
DERMABOND ADVANCED .7 DNX12 (GAUZE/BANDAGES/DRESSINGS) ×2 IMPLANT
DRAIN PENROSE 12X.25 LTX STRL (MISCELLANEOUS) ×2 IMPLANT
DRAPE INCISE IOBAN 66X45 STRL (DRAPES) IMPLANT
DRAPE LAPAROTOMY 100X77 ABD (DRAPES) ×2 IMPLANT
ELECT REM PT RETURN 9FT ADLT (ELECTROSURGICAL) ×2
ELECTRODE REM PT RTRN 9FT ADLT (ELECTROSURGICAL) ×2 IMPLANT
GAUZE 4X4 16PLY ~~LOC~~+RFID DBL (SPONGE) ×2 IMPLANT
GLOVE BIO SURGEON STRL SZ 6.5 (GLOVE) ×4 IMPLANT
GLOVE BIOGEL PI IND STRL 6.5 (GLOVE) ×2 IMPLANT
GOWN STRL REUS W/ TWL LRG LVL3 (GOWN DISPOSABLE) ×4 IMPLANT
GOWN STRL REUS W/TWL LRG LVL3 (GOWN DISPOSABLE) ×4
LABEL OR SOLS (LABEL) ×2 IMPLANT
MANIFOLD NEPTUNE II (INSTRUMENTS) ×2 IMPLANT
MESH HERNIA 6X13 (Mesh General) IMPLANT
NDL HYPO 22X1.5 SAFETY MO (MISCELLANEOUS) ×2 IMPLANT
NEEDLE HYPO 22X1.5 SAFETY MO (MISCELLANEOUS) ×2 IMPLANT
NS IRRIG 500ML POUR BTL (IV SOLUTION) ×2 IMPLANT
PACK BASIN MINOR ARMC (MISCELLANEOUS) ×2 IMPLANT
SUT MNCRL 4-0 (SUTURE) ×2
SUT MNCRL 4-0 27XMFL (SUTURE) ×2
SUT SURGILON 0 BLK (SUTURE) ×4 IMPLANT
SUT VIC AB 2-0 BRD 54 (SUTURE) ×2 IMPLANT
SUT VIC AB 2-0 CT2 27 (SUTURE) ×2 IMPLANT
SUT VIC AB 3-0 SH 27 (SUTURE) ×2
SUT VIC AB 3-0 SH 27X BRD (SUTURE) ×4 IMPLANT
SUTURE MNCRL 4-0 27XMF (SUTURE) ×2 IMPLANT
SYR 10ML LL (SYRINGE) ×2 IMPLANT
TRAP FLUID SMOKE EVACUATOR (MISCELLANEOUS) ×2 IMPLANT
WATER STERILE IRR 500ML POUR (IV SOLUTION) ×2 IMPLANT

## 2023-08-10 NOTE — Anesthesia Preprocedure Evaluation (Addendum)
Anesthesia Evaluation  Patient identified by MRN, date of birth, ID band Patient awake    Reviewed: Allergy & Precautions, NPO status , Patient's Chart, lab work & pertinent test results  History of Anesthesia Complications Negative for: history of anesthetic complications  Airway Mallampati: I  TM Distance: >3 FB Neck ROM: full    Dental no notable dental hx. 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 block  Minimal voltage criteria for LVH, may be normal variant ( R in aVL ) Septal infarct , age undetermined Abnormal ECG When compared with ECG of 25-Mar-2014 10:47, T wave inversion no longer evident in Inferior leads   Neuro/Psych  Neuromuscular disease  negative psych ROS   GI/Hepatic Neg liver ROS,GERD  Medicated,,  Endo/Other  diabetes, Type 2, Oral Hypoglycemic Agents    Renal/GU      Musculoskeletal   Abdominal   Peds  Hematology  (+) Blood dyscrasia, anemia   Anesthesia Other Findings Past Medical History: 06/04/2015: Abnormal findings on cytological and histological  examination of urine 06/04/2015: Abnormal urine cytology No date: Bladder cancer (HCC) 12/30/2014: Cervical radiculitis No date: Colon polyps 12/30/2014: DDD (degenerative disc disease), cervical No date: Diabetes mellitus without complication (HCC) No date: Diverticulosis 12/28/2012: ED (erectile dysfunction) of organic origin No date: Hemorrhoids 12/28/2012: History of bladder cancer No date: History of kidney stones No date: Hyperlipidemia No date: Hypertension 12/28/2012: Male erectile dysfunction No date: Microcytic hypochromic anemia 06/03/2015: Microscopic hematuria No date: Non-recurrent  unilateral inguinal hernia without obstruction  or gangrene 12/30/2014: Other cervical disc degeneration, unspecified cervical  region 12/28/2012: Personal history of prostate cancer No date: Prostate cancer [facility])  Past Surgical History: No date: APPENDECTOMY No date: CHOLECYSTECTOMY 11/14/2018: COLONOSCOPY WITH PROPOFOL; N/A     Comment:  Procedure: COLONOSCOPY WITH PROPOFOL;  Surgeon:               Christena Deem, MD;  Location: [facility] ENDOSCOPY;                Service: Endoscopy;  Laterality: N/A; No date: CYSTOSCOPY WITH BIOPSY No date: Laproscopic Prostatectomy No date: Removal of Nasal Polyps     Reproductive/Obstetrics negative OB ROS                             Anesthesia Physical Anesthesia Plan  ASA: 3  Anesthesia Plan: General ETT   Post-op Pain Management: Ofirmev IV (intra-op)* and Toradol IV (intra-op)*   Induction: Intravenous  PONV Risk Score and Plan: 2 and Ondansetron, Dexamethasone and Treatment may vary due to age or medical condition  Airway Management Planned: Oral ETT  Additional Equipment:   Intra-op Plan:   Post-operative Plan: Extubation in OR  Informed Consent: I have reviewed the patients History and Physical, chart, labs and discussed the procedure including the risks, benefits and alternatives for the proposed anesthesia with the patient or authorized representative who has indicated his/her understanding and acceptance.     Dental Advisory Given  Plan Discussed with: Anesthesiologist, CRNA and Surgeon  Anesthesia Plan Comments: (Patient consented for risks of anesthesia including but not limited to:  - adverse reactions to medications - damage to eyes, teeth, lips or other oral mucosa - nerve damage due to positioning  -  sore throat or hoarseness - Damage to heart, brain, nerves, lungs, other parts of body or loss of life  Patient voiced understanding and assent.)        Anesthesia Quick  Evaluation

## 2023-08-10 NOTE — Discharge Instructions (Addendum)
  Diet: Resume home heart healthy regular diet.   Activity: No heavy lifting >20 pounds (children, pets, laundry, garbage) or strenuous activity until follow-up, but light activity and walking are encouraged. Do not drive or drink alcohol if taking narcotic pain medications.  Wound care: May shower with soapy water and pat dry (do not rub incisions), but no baths or submerging incision underwater until follow-up. (no swimming)   Medications: Resume all home medications. For mild to moderate pain: acetaminophen (Tylenol) ***or ibuprofen (if no kidney disease). Combining Tylenol with alcohol can substantially increase your risk of causing liver disease. Narcotic pain medications, if prescribed, can be used for severe pain, though may cause nausea, constipation, and drowsiness. Do not combine Tylenol and Norco within a 6 hour period as Norco contains Tylenol. If you do not need the narcotic pain medication, you do not need to fill the prescription.  Call office ([phone]) at any time if any questions, worsening pain, fevers/chills, bleeding, drainage from incision site, or other concerns.   AMBULATORY SURGERY  DISCHARGE INSTRUCTIONS   The drugs that you were given will stay in your system until tomorrow so for the next 24 hours you should not:  Drive an automobile Make any legal decisions Drink any alcoholic beverage   You may resume regular meals tomorrow.  Today it is better to start with liquids and gradually work up to solid foods.  You may eat anything you prefer, but it is better to start with liquids, then soup and crackers, and gradually work up to solid foods.   Please notify your doctor immediately if you have any unusual bleeding, trouble breathing, redness and pain at the surgery site, drainage, fever, or pain not relieved by medication.    Additional Instructions:        Please contact your physician with any problems or Same Day Surgery at [phone], Monday  through Friday 6 am to 4 pm, or [place] [facility] [place] Main number at [phone].  

## 2023-08-10 NOTE — Op Note (Signed)
Preoperative diagnosis: Right Inguinal Hernia.  Postoperative diagnosis: Right Indirect Inguinal Hernia.  Procedure: Right Inguinal hernia repair with mesh  Anesthesia: General  Surgeon: Dr. Hazle Quant  Wound Classification: Clean  Indications:  Patient is a 82 y.o. male developed a symptomatic right inguinal hernia. Repair was indicated to avoid complications of incarceration, obstruction and pain, and a prosthetic mesh repair was elected.  Findings: 1. Vas Deferens and cord structures identified and preserved 2. An indirect inguinal hernia was identified 3. Pre Shaped mesh System used for repair 4. Adequate hemostasis achieved  Description of procedure: The patient was taken to the operating room. A time-out was completed verifying correct patient, procedure, site, positioning, and implant(s) and/or special equipment prior to beginning this procedure. spinal anesthesia was induced. The right groin was prepped and draped in the usual sterile fashion. An incision was marked in a natural skin crease and planned to end near the pubic tubercle.  The skin crease incision was made with a knife and deepened through Scarpa's and Camper's fascia with electrocautery until the aponeurosis of the external oblique was encountered. This was cleaned and the external ring was exposed. Hemostasis was achieved in the wound. An incision was made in the midportion of the external oblique aponeurosis in the direction of its fibers. The ilioinguinal nerve was identified and protected throughout the dissection. Flaps of the external oblique were developed cephalad and inferiorly.  The cord was identified. It was gently dissected free at the pubic tubercle and encircled with a Penrose drain. Attention was directed to the anteromedial aspect of the cord, where an indirect hernia sac was identified. The sac was carefully dissected free of the cord down to the level of the internal ring. The vas and testicular vessels  were identified and protected from harm. The sac was opened and contents were reduced. A finger was passed into the peritoneal cavity and the floor of the inguinal canal assessed and found to be strong. The femoral canal was palpated and no hernia identified. The sac was twisted and suture ligated with 2-0 silk. Redundant sac was excised and submitted to pathology. The stump of the sac was checked for hemostasis and allowed to retract into the abdomen.  Attention then turned to the floor of the canal, which appeared to be grossly weakened without a well-defined defect or sac. The Pre Shaped Hernia System mesh was inserted. Beginning at the pubic tubercle, the mesh was sutured to the inguinal ligament inferiorly and the conjoint tendon superiorly using interrupted 0 nonabsorbable sutures. Care was taken to assure that the mesh was placed in a relaxed fashion to avoid excessive tension and that no neurovascular structures were caught in the repair. Laterally, the tails of the mesh were crossed and the internal ring recreated.  Hemostasis was again checked. The Penrose drain was removed. The external oblique aponeurosis was closed with a running suture of 3-0 Vicryl, taking care not to catch the ilioinguinal nerve in the suture line. Scarpa's fascia was closed with interrupted 3-0 Vicryl.  The skin was closed with a subcuticular stitch of Monocryl 4-0. Dermabond was applied.  The testis was gently pulled down into its anatomic position in the scrotum.  The patient tolerated the procedure well and was taken to the postanesthesia care unit in stable condition.   Specimen: Hernia sac  Complications: None  Estimated Blood Loss: 5 mL

## 2023-08-10 NOTE — Anesthesia Postprocedure Evaluation (Signed)
Anesthesia Post Note  Patient: Chase Wallace  Procedure(s) Performed: HERNIA REPAIR INGUINAL ADULT (Right: Groin) INSERTION OF MESH (Right: Inguinal)  Patient location during evaluation: PACU Anesthesia Type: General Level of consciousness: awake and alert Pain management: pain level controlled Vital Signs Assessment: post-procedure vital signs reviewed and stable Respiratory status: spontaneous breathing, nonlabored ventilation, respiratory function stable and patient connected to nasal cannula oxygen Cardiovascular status: blood pressure returned to baseline and stable Postop Assessment: no apparent nausea or vomiting Anesthetic complications: no   No notable events documented.   Last Vitals:  Vitals:   08/10/23 0956 08/10/23 0959  BP:  (!) 141/53  Pulse: 96 (!) 106  Resp: (!) 22 18  Temp:  36.4 C  SpO2: 95% 100%    Last Pain:  Vitals:   08/10/23 0959  TempSrc: Temporal  PainSc: 5                  Louie Boston

## 2023-08-10 NOTE — Transfer of Care (Signed)
Immediate Anesthesia Transfer of Care Note  Patient: Chase Wallace  Procedure(s) Performed: HERNIA REPAIR INGUINAL ADULT (Right: Groin) INSERTION OF MESH (Right: Inguinal)  Patient Location: PACU  Anesthesia Type:General  Level of Consciousness: awake  Airway & Oxygen Therapy: Patient Spontanous Breathing and Patient connected to face mask oxygen  Post-op Assessment: Report given to RN and Post -op Vital signs reviewed and stable  Post vital signs: Reviewed and stable  Last Vitals:  Vitals Value Taken Time  BP 158/71 08/10/23 0901  Temp    Pulse 96 08/10/23 0905  Resp 17 08/10/23 0905  SpO2 100 % 08/10/23 0905  Vitals shown include unfiled device data.  Last Pain:  Vitals:   08/10/23 0615  TempSrc: Temporal  PainSc: 0-No pain         Complications: No notable events documented.

## 2023-08-10 NOTE — Interval H&P Note (Signed)
History and Physical Interval Note:  08/10/2023 7:05 AM  Chase Wallace  has presented today for surgery, with the diagnosis of K40.90 non recurrent unilateral inguinal hernia w/o obstruction or gangrene.  The various methods of treatment have been discussed with the patient and family. After consideration of risks, benefits and other options for treatment, the patient has consented to  Procedure(s): HERNIA REPAIR INGUINAL ADULT (Right) as a surgical intervention.  The patient's history has been reviewed, patient examined, no change in status, stable for surgery.  I have reviewed the patient's chart and labs.  Questions were answered to the patient's satisfaction.     Carolan Shiver

## 2023-08-10 NOTE — Anesthesia Procedure Notes (Signed)
Procedure Name: Intubation Date/Time: 08/10/2023 7:37 AM  Performed by: Elisabeth Pigeon, CRNAPre-anesthesia Checklist: Patient identified, Patient being monitored, Timeout performed, Emergency Drugs available and Suction available Patient Re-evaluated:Patient Re-evaluated prior to induction Oxygen Delivery Method: Circle system utilized Preoxygenation: Pre-oxygenation with 100% oxygen Induction Type: IV induction Ventilation: Mask ventilation without difficulty Laryngoscope Size: Mac, McGraph and 4 Grade View: Grade I Tube type: Oral Tube size: 7.5 mm Number of attempts: 1 Airway Equipment and Method: Stylet Placement Confirmation: ETT inserted through vocal cords under direct vision, positive ETCO2 and breath sounds checked- equal and bilateral Secured at: 21 cm Tube secured with: Tape Dental Injury: Teeth and Oropharynx as per pre-operative assessment

## 2023-08-11 ENCOUNTER — Encounter: Payer: Self-pay | Admitting: General Surgery

## 2023-08-11 LAB — SURGICAL PATHOLOGY

## 2023-11-01 DIAGNOSIS — M19031 Primary osteoarthritis, right wrist: Secondary | ICD-10-CM | POA: Diagnosis not present

## 2023-11-01 DIAGNOSIS — M1811 Unilateral primary osteoarthritis of first carpometacarpal joint, right hand: Secondary | ICD-10-CM | POA: Diagnosis not present

## 2023-11-02 ENCOUNTER — Other Ambulatory Visit: Payer: Self-pay

## 2023-11-02 DIAGNOSIS — D509 Iron deficiency anemia, unspecified: Secondary | ICD-10-CM

## 2023-11-03 ENCOUNTER — Inpatient Hospital Stay: Payer: Medicare PPO | Attending: Oncology

## 2023-11-03 DIAGNOSIS — D509 Iron deficiency anemia, unspecified: Secondary | ICD-10-CM | POA: Diagnosis not present

## 2023-11-03 LAB — IRON AND TIBC
Iron: 129 ug/dL (ref 45–182)
Saturation Ratios: 39 % (ref 17.9–39.5)
TIBC: 333 ug/dL (ref 250–450)
UIBC: 204 ug/dL

## 2023-11-03 LAB — CBC (CANCER CENTER ONLY)
HCT: 42.5 % (ref 39.0–52.0)
Hemoglobin: 13.9 g/dL (ref 13.0–17.0)
MCH: 27.5 pg (ref 26.0–34.0)
MCHC: 32.7 g/dL (ref 30.0–36.0)
MCV: 84.2 fL (ref 80.0–100.0)
Platelet Count: 215 10*3/uL (ref 150–400)
RBC: 5.05 MIL/uL (ref 4.22–5.81)
RDW: 15.9 % — ABNORMAL HIGH (ref 11.5–15.5)
WBC Count: 8.4 10*3/uL (ref 4.0–10.5)
nRBC: 0 % (ref 0.0–0.2)

## 2023-11-03 LAB — FERRITIN: Ferritin: 20 ng/mL — ABNORMAL LOW (ref 24–336)

## 2023-11-04 ENCOUNTER — Telehealth: Payer: Self-pay

## 2023-11-04 NOTE — Telephone Encounter (Signed)
Megan please let me what he can get and schedule accordingly. thanks

## 2023-11-04 NOTE — Telephone Encounter (Signed)
-----   Message from Chase Wallace sent at 11/03/2023  6:23 PM EST ----- He is no t anemic but he is still iron deficient. Does he wish to receive IV iron?

## 2023-11-04 NOTE — Telephone Encounter (Signed)
Called explained labs to patient. Patient is agreeable with proceeding with IV Iron. He will wait for call from scheduler.

## 2023-11-08 ENCOUNTER — Encounter: Payer: Self-pay | Admitting: Oncology

## 2023-11-16 ENCOUNTER — Inpatient Hospital Stay: Payer: Medicare PPO

## 2023-11-16 ENCOUNTER — Other Ambulatory Visit: Payer: Self-pay | Admitting: Oncology

## 2023-11-16 VITALS — BP 151/62 | HR 67 | Temp 97.2°F | Resp 16

## 2023-11-16 DIAGNOSIS — D508 Other iron deficiency anemias: Secondary | ICD-10-CM

## 2023-11-16 DIAGNOSIS — D509 Iron deficiency anemia, unspecified: Secondary | ICD-10-CM | POA: Diagnosis not present

## 2023-11-16 MED ORDER — IRON SUCROSE 20 MG/ML IV SOLN
200.0000 mg | INTRAVENOUS | Status: DC
  Administered 2023-11-16: 200 mg via INTRAVENOUS
  Filled 2023-11-16: qty 10

## 2023-11-18 ENCOUNTER — Inpatient Hospital Stay: Payer: Medicare PPO

## 2023-11-18 VITALS — BP 140/59 | HR 72 | Resp 16

## 2023-11-18 DIAGNOSIS — D508 Other iron deficiency anemias: Secondary | ICD-10-CM

## 2023-11-18 DIAGNOSIS — D509 Iron deficiency anemia, unspecified: Secondary | ICD-10-CM | POA: Diagnosis not present

## 2023-11-18 MED ORDER — IRON SUCROSE 20 MG/ML IV SOLN
200.0000 mg | INTRAVENOUS | Status: DC
Start: 2023-11-18 — End: 2023-11-18
  Administered 2023-11-18: 200 mg via INTRAVENOUS
  Filled 2023-11-18: qty 10

## 2023-11-23 ENCOUNTER — Inpatient Hospital Stay: Payer: Medicare PPO | Attending: Oncology

## 2023-11-23 VITALS — BP 154/69 | HR 68 | Temp 98.1°F | Resp 17

## 2023-11-23 DIAGNOSIS — D508 Other iron deficiency anemias: Secondary | ICD-10-CM

## 2023-11-23 DIAGNOSIS — D509 Iron deficiency anemia, unspecified: Secondary | ICD-10-CM | POA: Insufficient documentation

## 2023-11-23 MED ORDER — IRON SUCROSE 20 MG/ML IV SOLN
200.0000 mg | INTRAVENOUS | Status: DC
Start: 2023-11-23 — End: 2023-11-23
  Administered 2023-11-23: 200 mg via INTRAVENOUS
  Filled 2023-11-23: qty 10

## 2023-11-23 MED ORDER — SODIUM CHLORIDE 0.9% FLUSH
10.0000 mL | Freq: Once | INTRAVENOUS | Status: DC | PRN
  Filled 2023-11-23: qty 10

## 2023-11-23 NOTE — Patient Instructions (Signed)

## 2023-11-25 ENCOUNTER — Inpatient Hospital Stay: Payer: Medicare PPO

## 2023-11-25 VITALS — BP 125/61 | HR 61 | Temp 98.2°F | Resp 18

## 2023-11-25 DIAGNOSIS — D509 Iron deficiency anemia, unspecified: Secondary | ICD-10-CM | POA: Diagnosis not present

## 2023-11-25 DIAGNOSIS — D508 Other iron deficiency anemias: Secondary | ICD-10-CM

## 2023-11-25 MED ORDER — IRON SUCROSE 20 MG/ML IV SOLN
200.0000 mg | INTRAVENOUS | Status: DC
  Administered 2023-11-25: 200 mg via INTRAVENOUS
  Filled 2023-11-25: qty 10

## 2023-11-25 MED ORDER — SODIUM CHLORIDE 0.9% FLUSH
10.0000 mL | Freq: Once | INTRAVENOUS | Status: AC | PRN
  Administered 2023-11-25: 10 mL
  Filled 2023-11-25: qty 10

## 2023-11-30 ENCOUNTER — Inpatient Hospital Stay: Payer: Medicare PPO

## 2023-11-30 VITALS — BP 139/60 | HR 58 | Temp 96.4°F | Resp 16

## 2023-11-30 DIAGNOSIS — D509 Iron deficiency anemia, unspecified: Secondary | ICD-10-CM | POA: Diagnosis not present

## 2023-11-30 DIAGNOSIS — D508 Other iron deficiency anemias: Secondary | ICD-10-CM

## 2023-11-30 MED ORDER — IRON SUCROSE 20 MG/ML IV SOLN
200.0000 mg | INTRAVENOUS | Status: DC
  Administered 2023-11-30: 200 mg via INTRAVENOUS
  Filled 2023-11-30: qty 10

## 2023-11-30 MED ORDER — SODIUM CHLORIDE 0.9% FLUSH
10.0000 mL | Freq: Once | INTRAVENOUS | Status: DC | PRN
Start: 2023-11-30 — End: 2023-11-30
  Filled 2023-11-30: qty 10

## 2023-11-30 NOTE — Progress Notes (Signed)
Declined post-observation. Aware of risks. Vitals stable at discharge.

## 2023-11-30 NOTE — Patient Instructions (Signed)

## 2023-12-27 DIAGNOSIS — M1811 Unilateral primary osteoarthritis of first carpometacarpal joint, right hand: Secondary | ICD-10-CM | POA: Diagnosis not present

## 2024-02-02 ENCOUNTER — Other Ambulatory Visit: Payer: Self-pay

## 2024-02-02 DIAGNOSIS — D509 Iron deficiency anemia, unspecified: Secondary | ICD-10-CM

## 2024-02-03 ENCOUNTER — Encounter: Payer: Self-pay | Admitting: Oncology

## 2024-02-03 ENCOUNTER — Inpatient Hospital Stay: Payer: Medicare PPO | Attending: Oncology

## 2024-02-03 ENCOUNTER — Inpatient Hospital Stay: Payer: Medicare PPO | Admitting: Oncology

## 2024-02-03 DIAGNOSIS — D509 Iron deficiency anemia, unspecified: Secondary | ICD-10-CM

## 2024-02-03 LAB — CBC (CANCER CENTER ONLY)
HCT: 47.1 % (ref 39.0–52.0)
Hemoglobin: 15.9 g/dL (ref 13.0–17.0)
MCH: 29.3 pg (ref 26.0–34.0)
MCHC: 33.8 g/dL (ref 30.0–36.0)
MCV: 86.9 fL (ref 80.0–100.0)
Platelet Count: 185 10*3/uL (ref 150–400)
RBC: 5.42 MIL/uL (ref 4.22–5.81)
RDW: 13.7 % (ref 11.5–15.5)
WBC Count: 7.7 10*3/uL (ref 4.0–10.5)
nRBC: 0 % (ref 0.0–0.2)

## 2024-02-03 LAB — FERRITIN: Ferritin: 183 ng/mL (ref 24–336)

## 2024-02-03 LAB — IRON AND TIBC
Iron: 81 ug/dL (ref 45–182)
Saturation Ratios: 28 % (ref 17.9–39.5)
TIBC: 287 ug/dL (ref 250–450)
UIBC: 206 ug/dL

## 2024-02-05 ENCOUNTER — Encounter: Payer: Self-pay | Admitting: Oncology

## 2024-02-05 NOTE — Progress Notes (Signed)
 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  He is status post radical prostatectomy.  Subsequently he had a rise in his PSA sometime in 2021 and underwent radiation at the [place].  He has now been referred for iron  deficiency anemia.  Patient was supposed to go for hernia surgery with Dr. Charmel Cooter and as a part of his preoperative workup his H&H was found to be 8.3/28.6 with an MCV of 65.  Ferritin levels were low at 6 and iron  saturation 5%.   He has undergone colonoscopy with [facility] back in 2020 which did not show any evidence of bleeding.  Also states that he has gone through EGD in the past.  He has never had a capsule endoscopy.      Interval history-patient is doing well presently.  Patient denies any blood loss in his stool or urine.  Denies any dark melanotic stools.  ECOG PS- 0 Pain scale- 0   Review of systems- Review of Systems  Constitutional:  Negative for chills, fever, malaise/fatigue and weight loss.  HENT:  Negative for congestion, ear discharge and nosebleeds.   Eyes:  Negative for blurred vision.  Respiratory:  Negative for cough, hemoptysis, sputum production, shortness of breath and wheezing.   Cardiovascular:  Negative for chest pain, palpitations, orthopnea and claudication.  Gastrointestinal:  Negative for abdominal pain, blood in stool, constipation, diarrhea, heartburn, melena, nausea and vomiting.  Genitourinary:  Negative for dysuria, flank  pain, frequency, hematuria and urgency.  Musculoskeletal:  Negative for back pain, joint pain and myalgias.  Skin:  Negative for rash.  Neurological:  Negative for dizziness, tingling, focal weakness, seizures, weakness and headaches.  Endo/Heme/Allergies:  Does not bruise/bleed easily.  Psychiatric/Behavioral:  Negative for depression and suicidal ideas. The patient does not have insomnia.       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   Hemorrhoids    History of bladder cancer 12/28/2012   History of kidney stones    Hyperlipidemia    Hypertension    Male erectile dysfunction 12/28/2012   Microcytic hypochromic anemia    Microscopic hematuria 06/03/2015   Non-recurrent unilateral inguinal hernia without obstruction or gangrene    Other cervical disc degeneration, unspecified cervical region 12/30/2014   Personal history of prostate cancer 12/28/2012   Prostate cancer [facility])      Past Surgical History:  Procedure Laterality Date   APPENDECTOMY     CHOLECYSTECTOMY     COLONOSCOPY WITH PROPOFOL  N/A 11/14/2018   Procedure: COLONOSCOPY WITH PROPOFOL ;  Surgeon: Deveron Fly, MD;  Location: [facility] ENDOSCOPY;  Service: Endoscopy;  Laterality: N/A;   CYSTOSCOPY WITH BIOPSY     INGUINAL HERNIA REPAIR Right 08/10/2023   Procedure: HERNIA REPAIR INGUINAL ADULT;  Surgeon: Eldred Grego, MD;  Location: [facility] ORS;  Service: General;  Laterality: Right;   INSERTION OF MESH Right 08/10/2023   Procedure:  INSERTION OF MESH;  Surgeon: Eldred Grego, MD;  Location: [facility] ORS;  Service: General;  Laterality: Right;   Laproscopic Prostatectomy     Removal of Nasal Polyps      Social History   Socioeconomic History   Marital status: Widowed    Spouse name: Not on file   Number of children: Not on file   Years of education: Not on file   Highest education level: Not on file  Occupational History   Not on file  Tobacco Use   Smoking status: Former   Smokeless tobacco: Never  Vaping Use   Vaping status: Never Used  Substance and Sexual Activity   Alcohol use: Never   Drug use: Never   Sexual activity: Not Currently    Birth control/protection: None  Other Topics Concern   Not on file  Social History Narrative   Lives alone, has 2 dogs   Social Drivers of Corporate investment banker Strain: Not on file  Food Insecurity: Not on file  Transportation Needs: Not on file  Physical Activity: Not on file  Stress: Not on file  Social Connections: Not on file  Intimate Partner Violence: Not on file    Family History  Problem Relation Age of Onset   Diabetes Mother    Hypertension Mother    Osteoporosis Mother    Breast cancer Mother    Prostate cancer Father      Current Outpatient Medications:    acyclovir (ZOVIRAX) 400 MG tablet, Take by mouth., Disp: , Rfl:    acetaminophen  (TYLENOL ) 500 MG tablet, Take 1,000 mg by mouth every 6 (six) hours as needed., Disp: , Rfl:    amLODipine (NORVASC) 10 MG tablet, Take 10 mg by mouth., Disp: , Rfl:    Ascorbic Acid (VITA-C PO), Take 1 tablet by mouth as needed. 500 mg, Disp: , Rfl:    atorvastatin (LIPITOR) 20 MG tablet, Take 40 mg by mouth., Disp: , Rfl:    azelastine (ASTELIN) 0.1 % nasal spray, , Disp: , Rfl:    cyanocobalamin  (VITAMIN B12) 1000 MCG tablet, Take 1,000 mcg by mouth daily., Disp: , Rfl:    docusate sodium (COLACE) 100 MG capsule, Take 100 mg by mouth 2 (two) times daily as needed for mild constipation., Disp: ,  Rfl:    empagliflozin (JARDIANCE) 10 MG TABS tablet, Take 10 mg by mouth daily., Disp: , Rfl:    fluticasone (FLONASE) 50 MCG/ACT nasal spray, 1 spray as needed., Disp: , Rfl:    HYDROcodone -acetaminophen  (NORCO/VICODIN) 5-325 MG tablet, every 6 (six) hours as needed. (Patient not taking: Reported on 08/02/2023), Disp: , Rfl:    Iron -Vitamin C  65-125 MG TABS, Take 1 tablet by mouth daily. (Patient not taking: Reported on 08/02/2023), Disp: 30 tablet, Rfl: 3   ketoconazole  (NIZORAL ) 2 % cream, Apply topically to rash 1-2 times daily for 2-4 weeks. (Patient not taking: Reported on 08/02/2023), Disp: 60 each, Rfl: 1   lisinopril (PRINIVIL,ZESTRIL) 20 MG tablet, Take 40 mg by mouth., Disp: , Rfl:    magnesium (MAGTAB) 84 MG ( ) TBCR SR tablet, Take 84 mg by mouth daily., Disp: ,  Rfl:    Melatonin 1 MG TABS, Take 5 mg by mouth at bedtime as needed., Disp: , Rfl:    metFORMIN (GLUCOPHAGE) 500 MG tablet, Take 500 mg by mouth 2 (two) times daily with a meal., Disp: , Rfl:    omeprazole (PRILOSEC) 20 MG capsule, Take 20 mg by mouth as needed., Disp: , Rfl:    phenazopyridine  (PYRIDIUM ) 200 MG tablet, Take 1 tablet (200 mg total) by mouth 3 (three) times daily as needed for pain. (Patient not taking: Reported on 08/02/2023), Disp: 10 tablet, Rfl: 0   tamsulosin (FLOMAX) 0.4 MG CAPS capsule, Take 0.4 mg by mouth daily. (Patient not taking: Reported on 06/08/2023), Disp: , Rfl:   Physical exam:  Vitals:   02/03/24 1118  BP: (!) 155/85  Pulse: 70  Resp: 18  Temp: (!) 97.5 F (36.4 C)  TempSrc: Tympanic  SpO2: 98%  Weight: 152 lb (68.9 kg)  Height: 5\' 6"  (1.676 m)   Physical Exam Cardiovascular:     Rate and Rhythm: Normal rate and regular rhythm.     Heart sounds: Normal heart sounds.  Pulmonary:     Effort: Pulmonary effort is normal.     Breath sounds: Normal breath sounds.  Abdominal:     General: Bowel sounds are normal.     Palpations: Abdomen is soft.  Skin:    General: Skin is warm  and dry.  Neurological:     Mental Status: He is alert and oriented to person, place, and time.      I have personally reviewed labs listed below:    Latest Ref Rng & Units 06/09/2023   10:25 AM  CMP  Glucose 70 - 99 mg/dL 161   BUN 8 - 23 mg/dL 18   Creatinine 0.96 - 1.24 mg/dL 0.45   Sodium 409 - 811 mmol/L 139   Potassium 3.5 - 5.1 mmol/L 3.5   Chloride 98 - 111 mmol/L 106   CO2 22 - 32 mmol/L 22   Calcium 8.9 - 10.3 mg/dL 9.2       Latest Ref Rng & Units 02/03/2024   11:04 AM  CBC  WBC 4.0 - 10.5 K/uL 7.7   Hemoglobin 13.0 - 17.0 g/dL 91.4   Hematocrit 78.2 - 52.0 % 47.1   Platelets 150 - 400 K/uL 185      Assessment and plan- Patient is a 83 y.o. male seen for follow-up of ironDeficiency anemia  Patient is not presently anemic with an H&H of 15.9/47.1.  Ferritin levels are normal at 193 with normal iron  studies.  CBC ferritin and iron  studies in 4 and 8 months and I will see him back in 8 months   Visit Diagnosis 1. Iron  deficiency anemia, unspecified iron  deficiency anemia type      Dr. Seretha Dance, MD, MPH [facility] [phone] 02/05/2024 4:11 PM

## 2024-05-02 ENCOUNTER — Ambulatory Visit: Payer: Self-pay | Admitting: Urology

## 2024-06-01 ENCOUNTER — Inpatient Hospital Stay: Attending: Oncology

## 2024-06-01 DIAGNOSIS — Z8546 Personal history of malignant neoplasm of prostate: Secondary | ICD-10-CM | POA: Insufficient documentation

## 2024-06-01 DIAGNOSIS — D509 Iron deficiency anemia, unspecified: Secondary | ICD-10-CM | POA: Diagnosis not present

## 2024-06-01 LAB — IRON AND TIBC
Iron: 66 ug/dL (ref 45–182)
Saturation Ratios: 26 % (ref 17.9–39.5)
TIBC: 255 ug/dL (ref 250–450)
UIBC: 189 ug/dL

## 2024-06-01 LAB — CBC (CANCER CENTER ONLY)
HCT: 45.1 % (ref 39.0–52.0)
Hemoglobin: 15.2 g/dL (ref 13.0–17.0)
MCH: 29.5 pg (ref 26.0–34.0)
MCHC: 33.7 g/dL (ref 30.0–36.0)
MCV: 87.6 fL (ref 80.0–100.0)
Platelet Count: 195 K/uL (ref 150–400)
RBC: 5.15 MIL/uL (ref 4.22–5.81)
RDW: 13.4 % (ref 11.5–15.5)
WBC Count: 8.5 K/uL (ref 4.0–10.5)
nRBC: 0 % (ref 0.0–0.2)

## 2024-06-01 LAB — FERRITIN: Ferritin: 176 ng/mL (ref 24–336)

## 2024-07-24 DIAGNOSIS — M19042 Primary osteoarthritis, left hand: Secondary | ICD-10-CM | POA: Diagnosis not present

## 2024-07-24 DIAGNOSIS — Z5982 Transportation insecurity: Secondary | ICD-10-CM | POA: Diagnosis not present

## 2024-07-24 DIAGNOSIS — M1811 Unilateral primary osteoarthritis of first carpometacarpal joint, right hand: Secondary | ICD-10-CM | POA: Diagnosis not present

## 2024-07-31 DIAGNOSIS — M7918 Myalgia, other site: Secondary | ICD-10-CM | POA: Diagnosis not present

## 2024-09-03 DIAGNOSIS — Z79899 Other long term (current) drug therapy: Secondary | ICD-10-CM | POA: Diagnosis not present

## 2024-09-03 DIAGNOSIS — M5412 Radiculopathy, cervical region: Secondary | ICD-10-CM | POA: Diagnosis not present

## 2024-09-03 DIAGNOSIS — M19012 Primary osteoarthritis, left shoulder: Secondary | ICD-10-CM | POA: Diagnosis not present

## 2024-09-03 DIAGNOSIS — M62838 Other muscle spasm: Secondary | ICD-10-CM | POA: Diagnosis not present

## 2024-09-03 DIAGNOSIS — M19011 Primary osteoarthritis, right shoulder: Secondary | ICD-10-CM | POA: Diagnosis not present

## 2024-10-05 ENCOUNTER — Encounter: Payer: Self-pay | Admitting: Oncology

## 2024-10-05 ENCOUNTER — Ambulatory Visit: Admitting: Oncology

## 2024-10-05 ENCOUNTER — Inpatient Hospital Stay: Attending: Oncology

## 2024-10-05 VITALS — BP 129/65 | HR 65 | Temp 97.8°F | Resp 19 | Ht 66.0 in | Wt 142.8 lb

## 2024-10-05 DIAGNOSIS — Z87891 Personal history of nicotine dependence: Secondary | ICD-10-CM | POA: Insufficient documentation

## 2024-10-05 DIAGNOSIS — D509 Iron deficiency anemia, unspecified: Secondary | ICD-10-CM | POA: Diagnosis not present

## 2024-10-05 LAB — CBC (CANCER CENTER ONLY)
HCT: 46.5 % (ref 39.0–52.0)
Hemoglobin: 15.7 g/dL (ref 13.0–17.0)
MCH: 30.1 pg (ref 26.0–34.0)
MCHC: 33.8 g/dL (ref 30.0–36.0)
MCV: 89.1 fL (ref 80.0–100.0)
Platelet Count: 200 K/uL (ref 150–400)
RBC: 5.22 MIL/uL (ref 4.22–5.81)
RDW: 14.2 % (ref 11.5–15.5)
WBC Count: 8.7 K/uL (ref 4.0–10.5)
nRBC: 0 % (ref 0.0–0.2)

## 2024-10-05 LAB — FERRITIN: Ferritin: 264 ng/mL (ref 24–336)

## 2024-10-05 LAB — IRON AND TIBC
Iron: 76 ug/dL (ref 45–182)
Saturation Ratios: 29 % (ref 17.9–39.5)
TIBC: 265 ug/dL (ref 250–450)
UIBC: 188 ug/dL

## 2024-10-05 NOTE — Progress Notes (Signed)
 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  He is status post radical prostatectomy.  Subsequently he had a rise in his PSA sometime in 2021 and underwent radiation at the [place].  He has now been referred for iron  deficiency anemia.  Patient was supposed to go for hernia surgery with Dr. Cesar and as a part of his preoperative workup his H&H was found to be 8.3/28.6 with an MCV of 65.  Ferritin levels were low at 6 and iron  saturation 5%.   He has undergone colonoscopy with [facility] back in 2020 which did not show any evidence of bleeding.  Also states that he has gone through EGD in the past.  He has never had a capsule endoscopy.    Interval history- Chase Wallace is an 83 year old male with iron  deficiency anemia, currently well controlled, who presents for routine hematology follow-up.  He is asymptomatic and reports feeling well overall, with only mild morning fatigue. He denies dizziness, malaise, abnormal bleeding, or bruising. Hemoglobin is 15.7 g/dL. He reports that he no longer experiences the nonspecific symptoms he had prior to iron  supplementation one year ago. He previously experienced general malaise prior to hernia surgery, but these symptoms have resolved.  He adheres to dietary recommendations for diabetes, avoiding sweets and limiting soft drink intake. He consumes fish and shellfish and follows guidance from the [facility] hospital. Blood glucose  typically ranges from 6 to 8 mmol/L. He is motivated to maintain glycemic control due to concerns about diabetes-related complications, particularly limb loss.  He quit smoking over thirty years ago. He experienced a period of poor appetite and low mood following the loss of his wife, during which he consumed less food and made unhealthy dietary choices, but has since resumed healthier eating habits.       ECOG PS- 1 Pain scale- 0   Review of systems- Review of Systems  Constitutional:  Positive for malaise/fatigue. Negative for chills, fever and weight loss.  HENT:  Negative for congestion, ear discharge and nosebleeds.   Eyes:  Negative for blurred vision.  Respiratory:  Negative for cough, hemoptysis, sputum production, shortness of breath and wheezing.   Cardiovascular:  Negative for chest pain, palpitations, orthopnea and claudication.  Gastrointestinal:  Negative for abdominal pain, blood in stool, constipation, diarrhea, heartburn, melena, nausea and vomiting.  Genitourinary:  Negative for dysuria, flank pain, frequency, hematuria and urgency.  Musculoskeletal:  Negative for back pain, joint pain and myalgias.  Skin:  Negative for rash.  Neurological:  Negative for dizziness, tingling, focal weakness, seizures, weakness and headaches.  Endo/Heme/Allergies:  Does not bruise/bleed easily.  Psychiatric/Behavioral:  Negative for depression and suicidal ideas. The patient does not have insomnia.       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   Hemorrhoids    History of bladder cancer 12/28/2012    History of kidney stones    Hyperlipidemia    Hypertension    Male erectile dysfunction 12/28/2012   Microcytic hypochromic anemia    Microscopic hematuria 06/03/2015   Non-recurrent unilateral inguinal hernia without obstruction or gangrene    Other cervical disc degeneration, unspecified cervical region 12/30/2014   Personal history of prostate cancer 12/28/2012   Prostate cancer [facility])      Past Surgical History:  Procedure Laterality Date   APPENDECTOMY     CHOLECYSTECTOMY     COLONOSCOPY WITH PROPOFOL  N/A 11/14/2018   Procedure: COLONOSCOPY WITH PROPOFOL ;  Surgeon: Gaylyn Gladis PENNER, MD;  Location: [facility] ENDOSCOPY;  Service: Endoscopy;  Laterality: N/A;   CYSTOSCOPY WITH BIOPSY     INGUINAL HERNIA REPAIR Right 08/10/2023   Procedure: HERNIA REPAIR INGUINAL ADULT;  Surgeon: Rodolph Romano, MD;  Location: [facility] ORS;  Service: General;  Laterality: Right;   INSERTION OF MESH Right 08/10/2023   Procedure: INSERTION OF MESH;  Surgeon: Rodolph Romano, MD;  Location: [facility] ORS;  Service: General;  Laterality: Right;   Laproscopic Prostatectomy     Removal of Nasal Polyps      Social History   Socioeconomic History   Marital status: Widowed    Spouse name: Not on file   Number of children: Not on file   Years of education: Not on file   Highest education level: Not on file  Occupational History   Not on file  Tobacco Use   Smoking status: Former   Smokeless tobacco: Never  Vaping Use   Vaping status: Never Used  Substance and Sexual Activity   Alcohol use: Never   Drug use: Never   Sexual activity: Not Currently    Birth control/protection: None  Other Topics Concern   Not on file  Social History Narrative   Lives alone, has 2 dogs   Social Drivers of Health   Tobacco Use: Medium Risk (10/05/2024)   Patient History    Smoking Tobacco Use: Former    Smokeless Tobacco Use: Never    Passive Exposure: Not on Actuary Strain: Not on  file  Food Insecurity: No Food Insecurity (10/05/2024)   Epic    Worried About Programme Researcher, Broadcasting/film/video in the Last Year: Never true    Ran Out of Food in the Last Year: Never true  Transportation Needs: No Transportation Needs (10/05/2024)   Epic    Lack of Transportation (Medical): No    Lack of Transportation (Non-Medical): No  Physical Activity: Not on file  Stress: Not on file  Social Connections: Not on file  Intimate Partner Violence: Not At Risk (10/05/2024)   Epic    Fear of Current or Ex-Partner: No    Emotionally Abused: No    Physically Abused: No    Sexually Abused: No  Depression (PHQ2-9): Low Risk (10/05/2024)   Depression (PHQ2-9)    PHQ-2 Score: 0  Alcohol Screen: Not on file  Housing: Low Risk (10/05/2024)   Epic    Unable to Pay for Housing in the Last Year: No    Number of Times Moved in the Last Year: 0    Homeless in the Last Year: No  Utilities: Not At Risk (10/05/2024)   Epic    Threatened with loss  of utilities: No  Health Literacy: Not on file    Family History  Problem Relation Age of Onset   Diabetes Mother    Hypertension Mother    Osteoporosis Mother    Breast cancer Mother    Prostate cancer Father     Current Medications[2]  Physical exam:  Vitals:   10/05/24 1110 10/05/24 1113  BP: (!) 149/69 129/65  Pulse: 65   Resp: 19   Temp: 97.8 F (36.6 C)   TempSrc: Tympanic   SpO2: 98%   Weight: 142 lb 12.8 oz (64.8 kg)   Height: 5' 6 (1.676 m)    Physical Exam Cardiovascular:     Rate and Rhythm: Normal rate and regular rhythm.     Heart sounds: Normal heart sounds.  Pulmonary:     Effort: Pulmonary effort is normal.     Breath sounds: Normal breath sounds.  Skin:    General: Skin is warm and dry.  Neurological:     Mental Status: He is alert and oriented to person, place, and time.      I have personally reviewed labs listed below:    Latest Ref Rng & Units 06/09/2023   10:25 AM  CMP  Glucose 70 - 99 mg/dL 810   BUN 8 -  23 mg/dL 18   Creatinine 9.38 - 1.24 mg/dL 8.89   Sodium 864 - 854 mmol/L 139   Potassium 3.5 - 5.1 mmol/L 3.5   Chloride 98 - 111 mmol/L 106   CO2 22 - 32 mmol/L 22   Calcium 8.9 - 10.3 mg/dL 9.2       Latest Ref Rng & Units 10/05/2024   10:48 AM  CBC  WBC 4.0 - 10.5 K/uL 8.7   Hemoglobin 13.0 - 17.0 g/dL 84.2   Hematocrit 60.9 - 52.0 % 46.5   Platelets 150 - 400 K/uL 200       Assessment and plan- Patient is a 83 y.o. male here for routine follow-up of iron  deficiency anemia  Assessment and Plan    Iron  deficiency anemia Iron  deficiency anemia well-controlled and asymptomatic. Hemoglobin 15.7 g/dL, no evidence of ongoing deficiency. - Ordered repeat blood work in six months.I will see him in 1 year      Visit Diagnosis 1. Iron  deficiency anemia, unspecified iron  deficiency anemia type      Dr. Annah Skene, MD, MPH [facility] at [facility] [phone] 10/05/2024 3:18 PM                   [1]  Allergies Allergen Reactions   Flunisolide    Nsaids Other (See Comments)    Elevated creatinine  [2]  Current Outpatient Medications:    acetaminophen  (TYLENOL ) 500 MG tablet, Take 1,000 mg by mouth every 6 (six) hours as needed., Disp: , Rfl:    acyclovir (ZOVIRAX) 400 MG tablet, Take by mouth., Disp: , Rfl:    Alogliptin Benzoate 25 MG TABS, Take 1 tablet by mouth daily., Disp: , Rfl:    amLODipine (NORVASC) 10 MG tablet, Take 10 mg by mouth., Disp: , Rfl:    atorvastatin (LIPITOR) 20 MG tablet, Take 40 mg by mouth., Disp: , Rfl:    docusate sodium (COLACE) 100 MG capsule, Take 100 mg by mouth 2 (two) times daily as needed for mild constipation., Disp: , Rfl:    empagliflozin (JARDIANCE) 10 MG TABS tablet, Take 10 mg by mouth daily., Disp: , Rfl:    fluticasone (FLONASE) 50 MCG/ACT nasal  spray, 1 spray as needed., Disp: , Rfl:    lisinopril (PRINIVIL,ZESTRIL) 20 MG tablet, Take 40 mg by mouth., Disp: , Rfl:    magnesium (MAGTAB) 84 MG  ( ) TBCR SR tablet, Take 84 mg by mouth daily., Disp: , Rfl:    Melatonin 1 MG TABS, Take 5 mg by mouth at bedtime as needed., Disp: , Rfl:    metFORMIN (GLUCOPHAGE) 500 MG tablet, Take 500 mg by mouth 2 (two) times daily with a meal., Disp: , Rfl:    omeprazole (PRILOSEC) 20 MG capsule, Take 20 mg by mouth as needed., Disp: , Rfl:    Ascorbic Acid (VITA-C PO), Take 1 tablet by mouth as needed. 500 mg (Patient not taking: Reported on 10/05/2024), Disp: , Rfl:    azelastine (ASTELIN) 0.1 % nasal spray, , Disp: , Rfl:    cyanocobalamin  (VITAMIN B12) 1000 MCG tablet, Take 1,000 mcg by mouth daily. (Patient not taking: Reported on 10/05/2024), Disp: , Rfl:    HYDROcodone -acetaminophen  (NORCO/VICODIN) 5-325 MG tablet, every 6 (six) hours as needed. (Patient not taking: Reported on 08/02/2023), Disp: , Rfl:    Iron -Vitamin C  65-125 MG TABS, Take 1 tablet by mouth daily. (Patient not taking: Reported on 08/02/2023), Disp: 30 tablet, Rfl: 3   ketoconazole  (NIZORAL ) 2 % cream, Apply topically to rash 1-2 times daily for 2-4 weeks. (Patient not taking: Reported on 08/02/2023), Disp: 60 each, Rfl: 1   phenazopyridine  (PYRIDIUM ) 200 MG tablet, Take 1 tablet (200 mg total) by mouth 3 (three) times daily as needed for pain. (Patient not taking: Reported on 08/02/2023), Disp: 10 tablet, Rfl: 0   tamsulosin (FLOMAX) 0.4 MG CAPS capsule, Take 0.4 mg by mouth daily. (Patient not taking: Reported on 06/08/2023), Disp: , Rfl:   "

## 2025-04-05 ENCOUNTER — Inpatient Hospital Stay

## 2025-10-04 ENCOUNTER — Inpatient Hospital Stay: Admitting: Oncology

## 2025-10-04 ENCOUNTER — Inpatient Hospital Stay
